# Patient Record
Sex: Male | Born: 1935 | Race: White | Hispanic: No | Marital: Married | Smoking: Never smoker
Health system: Southern US, Community
[De-identification: ages and names within clinical notes are randomized; demographics above are authoritative.]

## PROBLEM LIST (undated history)

## (undated) DIAGNOSIS — R059 Cough, unspecified: Secondary | ICD-10-CM

## (undated) DIAGNOSIS — R011 Cardiac murmur, unspecified: Secondary | ICD-10-CM

## (undated) DIAGNOSIS — I4891 Unspecified atrial fibrillation: Secondary | ICD-10-CM

## (undated) DIAGNOSIS — F0390 Unspecified dementia without behavioral disturbance: Secondary | ICD-10-CM

## (undated) DIAGNOSIS — Z8719 Personal history of other diseases of the digestive system: Secondary | ICD-10-CM

## (undated) DIAGNOSIS — E785 Hyperlipidemia, unspecified: Secondary | ICD-10-CM

## (undated) DIAGNOSIS — I10 Essential (primary) hypertension: Secondary | ICD-10-CM

## (undated) DIAGNOSIS — R05 Cough: Secondary | ICD-10-CM

## (undated) DIAGNOSIS — M109 Gout, unspecified: Secondary | ICD-10-CM

## (undated) DIAGNOSIS — G43909 Migraine, unspecified, not intractable, without status migrainosus: Secondary | ICD-10-CM

## (undated) DIAGNOSIS — K219 Gastro-esophageal reflux disease without esophagitis: Secondary | ICD-10-CM

## (undated) DIAGNOSIS — R413 Other amnesia: Secondary | ICD-10-CM

## (undated) DIAGNOSIS — B029 Zoster without complications: Secondary | ICD-10-CM

## (undated) DIAGNOSIS — M1990 Unspecified osteoarthritis, unspecified site: Secondary | ICD-10-CM

## (undated) DIAGNOSIS — I499 Cardiac arrhythmia, unspecified: Secondary | ICD-10-CM

## (undated) DIAGNOSIS — T4145XA Adverse effect of unspecified anesthetic, initial encounter: Secondary | ICD-10-CM

## (undated) DIAGNOSIS — T8859XA Other complications of anesthesia, initial encounter: Secondary | ICD-10-CM

## (undated) HISTORY — DX: Other amnesia: R41.3

## (undated) HISTORY — DX: Zoster without complications: B02.9

## (undated) HISTORY — DX: Migraine, unspecified, not intractable, without status migrainosus: G43.909

## (undated) HISTORY — DX: Hyperlipidemia, unspecified: E78.5

## (undated) HISTORY — PX: KNEE SURGERY: SHX244

## (undated) HISTORY — PX: COLONOSCOPY: SHX5424

## (undated) HISTORY — DX: Unspecified dementia, unspecified severity, without behavioral disturbance, psychotic disturbance, mood disturbance, and anxiety: F03.90

## (undated) HISTORY — PX: AXILLARY SURGERY: SHX892

## (undated) HISTORY — DX: Unspecified atrial fibrillation: I48.91

## (undated) HISTORY — PX: ANKLE SURGERY: SHX546

---

## 2003-08-20 ENCOUNTER — Other Ambulatory Visit: Payer: Self-pay

## 2005-01-08 ENCOUNTER — Ambulatory Visit: Payer: Self-pay | Admitting: Internal Medicine

## 2005-09-28 ENCOUNTER — Ambulatory Visit: Payer: Self-pay | Admitting: Unknown Physician Specialty

## 2006-03-17 ENCOUNTER — Ambulatory Visit: Payer: Self-pay | Admitting: Unknown Physician Specialty

## 2006-12-27 ENCOUNTER — Ambulatory Visit: Payer: Self-pay | Admitting: Internal Medicine

## 2007-11-05 ENCOUNTER — Emergency Department: Payer: Self-pay | Admitting: Emergency Medicine

## 2007-11-10 ENCOUNTER — Ambulatory Visit: Payer: Self-pay | Admitting: Physician Assistant

## 2007-12-13 ENCOUNTER — Ambulatory Visit: Payer: Self-pay | Admitting: Otolaryngology

## 2008-12-24 ENCOUNTER — Ambulatory Visit: Payer: Self-pay | Admitting: Urology

## 2009-01-01 ENCOUNTER — Ambulatory Visit: Payer: Self-pay | Admitting: Urology

## 2010-03-26 ENCOUNTER — Other Ambulatory Visit: Payer: Self-pay | Admitting: Rheumatology

## 2010-11-30 ENCOUNTER — Ambulatory Visit: Payer: Self-pay | Admitting: Unknown Physician Specialty

## 2011-04-26 ENCOUNTER — Ambulatory Visit: Payer: Self-pay | Admitting: Orthopedic Surgery

## 2011-05-09 ENCOUNTER — Observation Stay: Payer: Self-pay | Admitting: Internal Medicine

## 2011-05-09 LAB — CBC
HGB: 16.5 g/dL (ref 13.0–18.0)
MCH: 30.3 pg (ref 26.0–34.0)
MCHC: 33.7 g/dL (ref 32.0–36.0)
MCV: 90 fL (ref 80–100)
Platelet: 208 10*3/uL (ref 150–440)
RBC: 5.44 10*6/uL (ref 4.40–5.90)
RDW: 13.8 % (ref 11.5–14.5)

## 2011-05-09 LAB — TROPONIN I: Troponin-I: <0.02 ng/mL

## 2011-05-09 LAB — BASIC METABOLIC PANEL
Anion Gap: 10 (ref 7–16)
BUN: 10 mg/dL (ref 7–18)
Calcium, Total: 9 mg/dL (ref 8.5–10.1)
Chloride: 105 mmol/L (ref 98–107)
EGFR (African American): >60
Osmolality: 282 (ref 275–301)
Potassium: 3.8 mmol/L (ref 3.5–5.1)
Sodium: 142 mmol/L (ref 136–145)

## 2011-05-09 LAB — CK TOTAL AND CKMB (NOT AT ARMC)
CK, Total: 132 U/L (ref 35–232)
CK-MB: 2.2 ng/mL (ref 0.5–3.6)
CK-MB: 1.5 ng/mL (ref 0.5–3.6)

## 2011-05-10 LAB — CBC WITH DIFFERENTIAL/PLATELET
Basophil #: 0.1 10*3/uL (ref 0.0–0.1)
Basophil %: 0.6 %
Eosinophil #: 0 10*3/uL (ref 0.0–0.7)
Eosinophil %: 0.5 %
HCT: 45.9 % (ref 40.0–52.0)
HGB: 15.4 g/dL (ref 13.0–18.0)
Lymphocyte #: 2.9 10*3/uL (ref 1.0–3.6)
Lymphocyte %: 33.3 %
MCH: 30.3 pg (ref 26.0–34.0)
MCHC: 33.6 g/dL (ref 32.0–36.0)
MCV: 90 fL (ref 80–100)
Monocyte #: 1 x10 3/mm (ref 0.2–1.0)
Monocyte %: 10.9 %
Neutrophil #: 4.8 10*3/uL (ref 1.4–6.5)
Neutrophil %: 54.7 %
Platelet: 188 10*3/uL (ref 150–440)
RBC: 5.09 10*6/uL (ref 4.40–5.90)
RDW: 13.8 % (ref 11.5–14.5)
WBC: 8.7 10*3/uL (ref 3.8–10.6)

## 2011-05-10 LAB — LIPID PANEL
Cholesterol: 192 mg/dL (ref 0–200)
Triglycerides: 176 mg/dL (ref 0–200)

## 2011-05-10 LAB — TROPONIN I: Troponin-I: 0.03 ng/mL

## 2011-05-10 LAB — CK TOTAL AND CKMB (NOT AT ARMC): CK, Total: 75 U/L (ref 35–232)

## 2011-05-21 ENCOUNTER — Emergency Department: Payer: Self-pay | Admitting: Emergency Medicine

## 2011-12-27 DIAGNOSIS — N486 Induration penis plastica: Secondary | ICD-10-CM | POA: Insufficient documentation

## 2011-12-27 DIAGNOSIS — N401 Enlarged prostate with lower urinary tract symptoms: Secondary | ICD-10-CM | POA: Insufficient documentation

## 2011-12-27 DIAGNOSIS — M545 Low back pain, unspecified: Secondary | ICD-10-CM | POA: Insufficient documentation

## 2011-12-27 DIAGNOSIS — E291 Testicular hypofunction: Secondary | ICD-10-CM | POA: Insufficient documentation

## 2013-05-12 ENCOUNTER — Emergency Department: Payer: Self-pay | Admitting: Emergency Medicine

## 2013-10-04 DIAGNOSIS — C4490 Unspecified malignant neoplasm of skin, unspecified: Secondary | ICD-10-CM | POA: Insufficient documentation

## 2013-10-04 DIAGNOSIS — I340 Nonrheumatic mitral (valve) insufficiency: Secondary | ICD-10-CM | POA: Insufficient documentation

## 2013-10-04 DIAGNOSIS — I429 Cardiomyopathy, unspecified: Secondary | ICD-10-CM | POA: Insufficient documentation

## 2013-10-04 DIAGNOSIS — I482 Chronic atrial fibrillation, unspecified: Secondary | ICD-10-CM | POA: Insufficient documentation

## 2013-10-25 ENCOUNTER — Ambulatory Visit: Payer: Self-pay | Admitting: Unknown Physician Specialty

## 2013-11-01 DIAGNOSIS — I10 Essential (primary) hypertension: Secondary | ICD-10-CM | POA: Insufficient documentation

## 2013-11-01 DIAGNOSIS — E782 Mixed hyperlipidemia: Secondary | ICD-10-CM | POA: Insufficient documentation

## 2013-11-01 DIAGNOSIS — I5022 Chronic systolic (congestive) heart failure: Secondary | ICD-10-CM | POA: Insufficient documentation

## 2013-11-28 ENCOUNTER — Ambulatory Visit: Payer: Self-pay | Admitting: Unknown Physician Specialty

## 2014-04-08 DIAGNOSIS — H409 Unspecified glaucoma: Secondary | ICD-10-CM | POA: Insufficient documentation

## 2014-04-08 DIAGNOSIS — R110 Nausea: Secondary | ICD-10-CM | POA: Insufficient documentation

## 2014-04-08 DIAGNOSIS — K449 Diaphragmatic hernia without obstruction or gangrene: Secondary | ICD-10-CM | POA: Insufficient documentation

## 2014-04-08 DIAGNOSIS — M1990 Unspecified osteoarthritis, unspecified site: Secondary | ICD-10-CM | POA: Insufficient documentation

## 2014-04-08 DIAGNOSIS — H25019 Cortical age-related cataract, unspecified eye: Secondary | ICD-10-CM | POA: Insufficient documentation

## 2014-04-08 DIAGNOSIS — G629 Polyneuropathy, unspecified: Secondary | ICD-10-CM | POA: Insufficient documentation

## 2014-04-08 DIAGNOSIS — K219 Gastro-esophageal reflux disease without esophagitis: Secondary | ICD-10-CM | POA: Insufficient documentation

## 2014-04-08 DIAGNOSIS — N400 Enlarged prostate without lower urinary tract symptoms: Secondary | ICD-10-CM | POA: Insufficient documentation

## 2014-05-05 NOTE — H&P (Signed)
PATIENT NAME:  Glenn Garcia, BANAS MR#:  [id number] DATE OF BIRTH:  15-Nov-1935  DATE OF ADMISSION:  05/09/2011  PRIMARY CARE PHYSICIAN:  Dr. Doy Hutching.  ER PHYSICIAN: Dr. Belva Bertin.   CHIEF COMPLAINT: Chest pain.   HISTORY OF PRESENT ILLNESS: The patient is a 79 year old male with history of hypertension, gastroesophageal reflux disease, glaucoma came in because of chest pain when he was at church. The patient went to church this morning and then suddenly felt very weak and tired and associated with chest tightness, which lasted for 2-3 minutes and then went away. Again, he felt very dizzy and had nausea and trouble breathing and chest pressure. Again, it lasted for five minutes. No radiation. No aggravating or relieving factors. The patient received aspirin in the ER and now chest pain free.   PAST MEDICAL HISTORY:  1. Hypertension.  2. Chronic atrial fibrillation.  3. Glaucoma. 4. Gastroesophageal reflux disease.   ALLERGIES: No known allergies.   SOCIAL HISTORY: No smoking, no drinking, no drugs.   FAMILY HISTORY: Significant for diabetes.   MEDICATIONS:  1. Omeprazole 20 mg daily.  2. Amlodipine 10 mg daily.   PAST SURGICAL HISTORY:  1. History of hand surgery when he was young, around 79 years old. 2. History of leg surgery.   REVIEW OF SYSTEMS: CONSTITUTIONAL: He has fatigue and weakness. EYES: History of glaucoma present. ENT: Occasional hearing loss and denies any epistaxis. No difficulty swallowing. RESPIRATORY: Has cough because of allergies, but no trouble breathing. No asthma. CARDIOVASCULAR: Had chest pressure this morning. No orthopnea. No PND. No palpitations. GASTROINTESTINAL: Has some nausea this morning, but no abdominal pain. Has gastroesophageal reflux disease. Occasional constipation. GENITOURINARY: No dysuria. ENDOCRINE: No polyuria or nocturia. INTEGUMENTARY: No skin rash. MUSCULOSKELETAL: No joint pain. NEUROLOGIC: No numbness or weakness. No transient ischemic  attacks. Stable mood and affect. PSYCH: The patient has no depression.   PHYSICAL EXAMINATION:  VITAL SIGNS: Temperature 96, pulse 71, respirations 24, blood pressure 106/81, saturations 98% on room air.   GENERAL: Awake, oriented, answering questions appropriately.   HEENT: Pupils are equal, round and reactive to light and accommodation. Extraocular movements intact. No scleral icterus. No conjunctivitis. No pharyngeal erythema. The patient has normal mucosa. Dentition is good. Tympanic membranes are clear.   NECK: No thyroid enlargement. No masses. No lymphadenopathy. No JVD. No carotid bruit.   RESPIRATORY: Bilaterally clear to auscultation. No wheeze. No rales.   CARDIOVASCULAR: S1 and S2 regular. He is in atrial fib. PMI not displaced. Good pedal pulses. Good femoral and dorsalis pedis. The patient has no lower extremity edema.   ABDOMEN: Soft, nontender, nondistended. Bowel sounds present. No hernias.   MUSCULOSKELETAL: Power 5/5 in upper and lower extremities. Sensory intact. DP pulses 2+ bilaterally.   SKIN: No skin rashes.   LYMPH NODES: No lymphadenopathy.   NEUROLOGICAL: Cranial nerves II through XII are intact. Power 5/5 in upper and lower extremities. Sensation is intact, 2+ bilaterally.   PSYCH: Oriented to time, place, and person.   LABORATORY, DIAGNOSTIC AND RADIOLOGICAL DATA: Chest x-ray showed no acute disease of the chest. WBC 6.6, hemoglobin 16.5, hematocrit 49, platelets 208. Electrolytes: Sodium 142, potassium 3.8, chloride 105, bicarbonate 27, BUN 10, creatinine 0.92, glucose 101. Troponin less than 0.02. CK total 132, CPK-MB 2.2. EKG showed atrial fibrillation with ventricular response 80 beats per minute.   ASSESSMENT AND PLAN:  54. 79 year old male with chest pain associated with dizziness and weakness. Rule out myocardial infarction. The patient will have two more  sets of troponin and will continue aspirin, beta blockers, nitrates. He will also have fasting  lipids checked. Get a stress test in the morning.  2. Chronic atrial fib. The patient has seen Dr. Nehemiah Massed before and was prescribed full dose aspirin, but he said he took two months and then developed a lot of bruising and then stopped by himself. We are going to continue aspirin 325 daily and also get an echo and continue Lovenox. Further anticoagulation depending on echo results.  3. Gastroesophageal reflux disease. Continue PPIs.  4. History of glaucoma. Continue eyedrops.   TOTAL TIME SPENT ON HISTORY AND PHYSICAL: About 55 minutes.  The patient will be seen by Dr. Doy Hutching tomorrow.    ____________________________ Epifanio Lesches, MD sk:ap D: 05/09/2011 13:15:04 ET T: 05/09/2011 13:53:30 ET JOB#: [id number]  cc: Epifanio Lesches, MD, <Dictator> Leonie Douglas. Doy Hutching, MD Epifanio Lesches MD ELECTRONICALLY SIGNED 05/10/2011 12:54

## 2014-05-05 NOTE — Consult Note (Signed)
General Aspect This is a 79 year old male that developed chest pain while in church yesterday, he presented to the ER.  He described this as a sharp pain that ran through the center of his chest. He became dizzy, nauseated and presented to the emergency room. His EKG was negative for acute changes.  He does have chronic afebrile.  His cardiac enzymes have been negative. after he was admitted he developed further chest pain again with dizziness, nausea.  He was moved to the unit for further monitoring.  He is in chronic atrial fibrillation, rate controlled.  He has a chads2 score of 1 indisposed be on 325 mg of aspirin.  However, he developed bruising and has been taking a baby aspirin a day.  Since his episode yesterday.  He has been feeling back to his usual baseline.  He has no complaints today.  He is pending a stress test and an echo.   Physical Exam:   GEN well developed    HEENT pink conjunctivae, hearing intact to voice    NECK supple    RESP normal resp effort  clear BS    CARD Irregular rate and rhythm  Afib controlled    ABD denies tenderness  normal BS    EXTR negative edema    SKIN normal to palpation    NEURO cranial nerves intact, motor/sensory function intact    PSYCH alert, A+O to time, place, person, good insight   Review of Systems:   Subjective/Chief Complaint Sharp chest pain associated with dizziness, nausea.    General: No Complaints    Skin: No Complaints    ENT: No Complaints    Eyes: No Complaints    Neck: No Complaints    Respiratory: No Complaints    Cardiovascular: Chest pain or discomfort    Gastrointestinal: No Complaints    Genitourinary: No Complaints    Musculoskeletal: No Complaints    Neurologic: No Complaints    Hematologic: No Complaints    Endocrine: No Complaints    Psychiatric: No Complaints    Medications/Allergies Reviewed Medications/Allergies reviewed   Routine Hem:  28-Apr-13 10:14    WBC (CBC) 6.6   RBC  (CBC) 5.44   Hemoglobin (CBC) 16.5   Hematocrit (CBC) 49.0   Platelet Count (CBC) 208   MCV 90   MCH 30.3   MCHC 33.7   RDW 13.8  Routine Chem:  28-Apr-13 10:14    Glucose, Serum 101   BUN 10   Creatinine (comp) 0.92   Sodium, Serum 142   Potassium, Serum 3.8   Chloride, Serum 105   CO2, Serum 27   Calcium (Total), Serum 9.0   Anion Gap 10   Osmolality (calc) 282   eGFR (African American) >60   eGFR (Non-African American) >60  Cardiac:  28-Apr-13 10:14    Troponin I < 0.02   CK, Total 132   CPK-MB, Serum 2.2  Blood Glucose:  28-Apr-13 16:37    POCT Blood Glucose 135  Cardiology:  28-Apr-13 16:43    Ventricular Rate 69   Atrial Rate 68   QRS Duration 78   QT 406   QTc 435   R Axis -29   T Axis 28  Cardiac:  28-Apr-13 17:32    Troponin I < 0.02   CK, Total 94   CPK-MB, Serum 1.5  Routine Hem:  29-Apr-13 02:09    WBC (CBC) 8.7   RBC (CBC) 5.09   Hemoglobin (CBC) 15.4   Hematocrit (CBC)  45.9   Platelet Count (CBC) 188   MCV 90   MCH 30.3   MCHC 33.6   RDW 13.8  Cardiac:  29-Apr-13 02:09    Troponin I 0.03   CK, Total 75   CPK-MB, Serum 1.3  Routine Hem:  29-Apr-13 02:09    Neutrophil % 54.7   Lymphocyte % 33.3   Monocyte % 10.9   Eosinophil % 0.5   Basophil % 0.6   Neutrophil # 4.8   Lymphocyte # 2.9   Monocyte # 1.0   Eosinophil # 0.0   Basophil # 0.1  Routine Chem:  29-Apr-13 02:09    Cholesterol, Serum 192   Triglycerides, Serum 176   HDL (INHOUSE) 44   VLDL Cholesterol Calculated 35   LDL Cholesterol Calculated 113  Blood Glucose:  29-Apr-13 07:49    POCT Blood Glucose 92   Radiology Results: XRay:    28-Apr-13 12:06, Chest Portable Single View   Chest Portable Single View    REASON FOR EXAM:    chest pain  COMMENTS:       PROCEDURE: DXR - DXR PORTABLE CHEST SINGLE VIEW  - May 09 2011 12:06PM     RESULT: Comparison: 11/05/2007    Findings:     Single portable AP chest radiograph is provided.  There is no focal    parenchymal opacity, pleural effusion, or pneumothorax. Normal   cardiomediastinal silhouette. The osseous structures are unremarkable.    IMPRESSION:     No acute disease of the chest.    Dictation Site: 3          Verified By: Jennette Banker, M.D., MD  Cardiology:    28-Apr-13 16:43, ECG   QRS Duration 78    No Known Allergies:   Vital Signs/Nurse's Notes: **Vital Signs.:   29-Apr-13 14:00   Vital Signs Type Routine   Pulse Pulse 74   Respirations Respirations 18   Systolic BP Systolic BP 459   Diastolic BP (mmHg) Diastolic BP (mmHg) 78   Mean BP 90   Pulse Ox % Pulse Ox % 96   Oxygen Delivery Room Air/ 21 %     Impression 79 year old male with chronic atrial fibrillation, presenting with sharp chest pain, dizziness, nausea, with negative EKG and cardiac enzymes feeling back to his usual self today.    Plan 1.  Continue current medications without change. 2.  Stress Myoview pending. 3.  Surface echocardiogram pending. 4..  Further recommendations per Dr. Nehemiah Massed after the above test results are known.  patient was sitting collaboration with Serafina Royals, M.D.   Electronic Signatures: Roderic Palau (NP)  (Signed 29-Apr-13 15:21)  Authored: General Aspect/Present Illness, History and Physical Exam, Review of System, Labs, Radiology, Allergies, Vital Signs/Nurse's Notes, Impression/Plan   Last Updated: 29-Apr-13 15:21 by Roderic Palau (NP)

## 2014-05-06 LAB — SURGICAL PATHOLOGY

## 2014-06-13 DIAGNOSIS — I10 Essential (primary) hypertension: Secondary | ICD-10-CM | POA: Insufficient documentation

## 2014-07-01 DIAGNOSIS — I071 Rheumatic tricuspid insufficiency: Secondary | ICD-10-CM | POA: Insufficient documentation

## 2015-01-01 DIAGNOSIS — I340 Nonrheumatic mitral (valve) insufficiency: Secondary | ICD-10-CM | POA: Insufficient documentation

## 2015-01-08 ENCOUNTER — Encounter: Payer: Self-pay | Admitting: *Deleted

## 2015-01-16 ENCOUNTER — Ambulatory Visit
Admission: RE | Admit: 2015-01-16 | Discharge: 2015-01-16 | Disposition: A | Discharge status: Home / Self Care | Payer: Medicare HMO | Source: Ambulatory Visit | Attending: Ophthalmology | Admitting: Ophthalmology

## 2015-01-16 ENCOUNTER — Encounter: Payer: Self-pay | Admitting: *Deleted

## 2015-01-16 ENCOUNTER — Ambulatory Visit: Payer: Medicare HMO | Admitting: Certified Registered"

## 2015-01-16 ENCOUNTER — Encounter
Admission: RE | Disposition: A | Discharge status: Home / Self Care | Payer: Self-pay | Source: Ambulatory Visit | Attending: Ophthalmology

## 2015-01-16 DIAGNOSIS — Z7901 Long term (current) use of anticoagulants: Secondary | ICD-10-CM | POA: Insufficient documentation

## 2015-01-16 DIAGNOSIS — M109 Gout, unspecified: Secondary | ICD-10-CM | POA: Diagnosis not present

## 2015-01-16 DIAGNOSIS — Z9889 Other specified postprocedural states: Secondary | ICD-10-CM | POA: Diagnosis not present

## 2015-01-16 DIAGNOSIS — Z888 Allergy status to other drugs, medicaments and biological substances status: Secondary | ICD-10-CM | POA: Insufficient documentation

## 2015-01-16 DIAGNOSIS — Z85828 Personal history of other malignant neoplasm of skin: Secondary | ICD-10-CM | POA: Diagnosis not present

## 2015-01-16 DIAGNOSIS — Z85118 Personal history of other malignant neoplasm of bronchus and lung: Secondary | ICD-10-CM | POA: Insufficient documentation

## 2015-01-16 DIAGNOSIS — K219 Gastro-esophageal reflux disease without esophagitis: Secondary | ICD-10-CM | POA: Insufficient documentation

## 2015-01-16 DIAGNOSIS — H2511 Age-related nuclear cataract, right eye: Secondary | ICD-10-CM | POA: Diagnosis not present

## 2015-01-16 DIAGNOSIS — H2513 Age-related nuclear cataract, bilateral: Secondary | ICD-10-CM | POA: Diagnosis not present

## 2015-01-16 DIAGNOSIS — Z79899 Other long term (current) drug therapy: Secondary | ICD-10-CM | POA: Insufficient documentation

## 2015-01-16 DIAGNOSIS — M1991 Primary osteoarthritis, unspecified site: Secondary | ICD-10-CM | POA: Insufficient documentation

## 2015-01-16 DIAGNOSIS — I4891 Unspecified atrial fibrillation: Secondary | ICD-10-CM | POA: Insufficient documentation

## 2015-01-16 DIAGNOSIS — E7800 Pure hypercholesterolemia, unspecified: Secondary | ICD-10-CM | POA: Insufficient documentation

## 2015-01-16 DIAGNOSIS — I10 Essential (primary) hypertension: Secondary | ICD-10-CM | POA: Diagnosis not present

## 2015-01-16 DIAGNOSIS — T7840XA Allergy, unspecified, initial encounter: Secondary | ICD-10-CM | POA: Diagnosis not present

## 2015-01-16 DIAGNOSIS — H269 Unspecified cataract: Secondary | ICD-10-CM | POA: Diagnosis present

## 2015-01-16 HISTORY — DX: Cough, unspecified: R05.9

## 2015-01-16 HISTORY — DX: Other complications of anesthesia, initial encounter: T88.59XA

## 2015-01-16 HISTORY — DX: Gout, unspecified: M10.9

## 2015-01-16 HISTORY — DX: Unspecified osteoarthritis, unspecified site: M19.90

## 2015-01-16 HISTORY — DX: Cardiac murmur, unspecified: R01.1

## 2015-01-16 HISTORY — DX: Personal history of other diseases of the digestive system: Z87.19

## 2015-01-16 HISTORY — DX: Gastro-esophageal reflux disease without esophagitis: K21.9

## 2015-01-16 HISTORY — PX: CATARACT EXTRACTION W/PHACO: SHX586

## 2015-01-16 HISTORY — DX: Essential (primary) hypertension: I10

## 2015-01-16 HISTORY — DX: Cardiac arrhythmia, unspecified: I49.9

## 2015-01-16 HISTORY — DX: Adverse effect of unspecified anesthetic, initial encounter: T41.45XA

## 2015-01-16 HISTORY — DX: Cough: R05

## 2015-01-16 SURGERY — PHACOEMULSIFICATION, CATARACT, WITH IOL INSERTION
Anesthesia: Monitor Anesthesia Care | Laterality: Right | Wound class: Clean

## 2015-01-16 MED ORDER — NA HYALUR & NA CHOND-NA HYALUR 0.55-0.5 ML IO KIT
PACK | INTRAOCULAR | Status: AC
  Filled 2015-01-16: qty 1.05

## 2015-01-16 MED ORDER — POVIDONE-IODINE 5 % OP SOLN
1.0000 "application " | OPHTHALMIC | Status: AC | PRN
  Administered 2015-01-16: 1 via OPHTHALMIC

## 2015-01-16 MED ORDER — ARMC OPHTHALMIC DILATING GEL
1.0000 "application " | OPHTHALMIC | Status: AC | PRN
  Administered 2015-01-16 (×2): 1 via OPHTHALMIC

## 2015-01-16 MED ORDER — EPINEPHRINE HCL 1 MG/ML IJ SOLN
INTRAMUSCULAR | Status: AC
  Filled 2015-01-16: qty 1

## 2015-01-16 MED ORDER — ARMC OPHTHALMIC DILATING GEL
OPHTHALMIC | Status: AC
  Administered 2015-01-16: 1 via OPHTHALMIC
  Filled 2015-01-16: qty 0.25

## 2015-01-16 MED ORDER — MOXIFLOXACIN HCL 0.5 % OP SOLN: 1.0000 [drp] | OPHTHALMIC | Status: DC | PRN

## 2015-01-16 MED ORDER — NA HYALUR & NA CHOND-NA HYALUR 0.4-0.35 ML IO KIT
PACK | INTRAOCULAR | Status: DC | PRN
  Administered 2015-01-16: .75 mL via INTRAOCULAR

## 2015-01-16 MED ORDER — MOXIFLOXACIN HCL 0.5 % OP SOLN
OPHTHALMIC | Status: AC
  Filled 2015-01-16: qty 3

## 2015-01-16 MED ORDER — TETRACAINE HCL 0.5 % OP SOLN
1.0000 [drp] | OPHTHALMIC | Status: AC | PRN
  Administered 2015-01-16: 1 [drp] via OPHTHALMIC

## 2015-01-16 MED ORDER — NEOMYCIN-POLYMYXIN-DEXAMETH 3.5-10000-0.1 OP OINT
TOPICAL_OINTMENT | OPHTHALMIC | Status: AC
  Filled 2015-01-16: qty 3.5

## 2015-01-16 MED ORDER — CEFUROXIME OPHTHALMIC INJECTION 1 MG/0.1 ML
INJECTION | OPHTHALMIC | Status: DC | PRN
  Administered 2015-01-16: .1 mL via INTRACAMERAL

## 2015-01-16 MED ORDER — POVIDONE-IODINE 5 % OP SOLN
OPHTHALMIC | Status: AC
  Administered 2015-01-16: 1 via OPHTHALMIC
  Filled 2015-01-16: qty 30

## 2015-01-16 MED ORDER — TETRACAINE HCL 0.5 % OP SOLN
OPHTHALMIC | Status: AC
  Administered 2015-01-16: 1 [drp] via OPHTHALMIC
  Filled 2015-01-16: qty 2

## 2015-01-16 MED ORDER — SODIUM CHLORIDE 0.9 % IV SOLN
INTRAVENOUS | Status: DC
  Administered 2015-01-16: 07:00:00 via INTRAVENOUS

## 2015-01-16 MED ORDER — NEOMYCIN-POLYMYXIN-DEXAMETH 0.1 % OP OINT
TOPICAL_OINTMENT | OPHTHALMIC | Status: DC | PRN
  Administered 2015-01-16: 1 via OPHTHALMIC

## 2015-01-16 MED ORDER — LIDOCAINE HCL (PF) 1 % IJ SOLN
INTRAMUSCULAR | Status: AC
  Filled 2015-01-16: qty 2

## 2015-01-16 MED ORDER — LIDOCAINE HCL (PF) 4 % IJ SOLN
INTRAMUSCULAR | Status: AC
  Filled 2015-01-16: qty 5

## 2015-01-16 MED ORDER — EPINEPHRINE HCL 1 MG/ML IJ SOLN
INTRAOCULAR | Status: DC | PRN
  Administered 2015-01-16: 250 mL via OPHTHALMIC

## 2015-01-16 MED ORDER — CARBACHOL 0.01 % IO SOLN
INTRAOCULAR | Status: DC | PRN
  Administered 2015-01-16: .5 mL via INTRAOCULAR

## 2015-01-16 MED ORDER — CEFUROXIME OPHTHALMIC INJECTION 1 MG/0.1 ML
INJECTION | OPHTHALMIC | Status: AC
  Filled 2015-01-16: qty 0.1

## 2015-01-16 SURGICAL SUPPLY — 22 items
CANNULA ANT/CHMB 27GA (MISCELLANEOUS) ×2 IMPLANT
CUP MEDICINE 2OZ PLAST GRAD ST (MISCELLANEOUS) ×2 IMPLANT
GLOVE BIO SURGEON STRL SZ8 (GLOVE) ×2 IMPLANT
GLOVE BIOGEL M 6.5 STRL (GLOVE) ×2 IMPLANT
GLOVE SURG LX 7.5 STRW (GLOVE) ×1
GLOVE SURG LX STRL 7.5 STRW (GLOVE) ×1 IMPLANT
GOWN STRL REUS W/ TWL LRG LVL3 (GOWN DISPOSABLE) ×2 IMPLANT
GOWN STRL REUS W/TWL LRG LVL3 (GOWN DISPOSABLE) ×2
LENS IOL TECNIS 20.0 (Intraocular Lens) ×2 IMPLANT
LENS IOL TECNIS MONO 1P 20.0 (Intraocular Lens) ×1 IMPLANT
PACK CATARACT (MISCELLANEOUS) ×2 IMPLANT
PACK CATARACT BRASINGTON LX (MISCELLANEOUS) ×2 IMPLANT
PACK EYE AFTER SURG (MISCELLANEOUS) ×2 IMPLANT
SOL BSS BAG (MISCELLANEOUS) ×2
SOL PREP PVP 2OZ (MISCELLANEOUS) ×2
SOLUTION BSS BAG (MISCELLANEOUS) ×1 IMPLANT
SOLUTION PREP PVP 2OZ (MISCELLANEOUS) ×1 IMPLANT
SYR 3ML LL SCALE MARK (SYRINGE) ×2 IMPLANT
SYR 5ML LL (SYRINGE) ×2 IMPLANT
SYR TB 1ML 27GX1/2 LL (SYRINGE) ×2 IMPLANT
WATER STERILE IRR 1000ML POUR (IV SOLUTION) ×2 IMPLANT
WIPE NON LINTING 3.25X3.25 (MISCELLANEOUS) ×2 IMPLANT

## 2015-01-16 NOTE — Anesthesia Postprocedure Evaluation (Signed)
Anesthesia Post Note  Patient: Glenn Garcia  Procedure(s) Performed: Procedure(s) (LRB): CATARACT EXTRACTION PHACO AND INTRAOCULAR LENS PLACEMENT (IOC) (Right)  Patient location during evaluation: Short Stay Anesthesia Type: MAC Level of consciousness: awake and alert, oriented and patient cooperative Pain management: satisfactory to patient Vital Signs Assessment: post-procedure vital signs reviewed and stable Respiratory status: respiratory function stable Cardiovascular status: stable Postop Assessment: no headache and no backache Anesthetic complications: no    Last Vitals:  Filed Vitals:   01/16/15 0619  BP: 129/79  Pulse: 84  Temp: 35.8 C  Resp: 16    Last Pain: There were no vitals filed for this visit.               Silvana Newness A

## 2015-01-16 NOTE — Op Note (Signed)
OPERATIVE NOTE  Jaesean Olstad Uhls [id number] 01/16/2015   PREOPERATIVE DIAGNOSIS:  Nuclear Sclerotic Cataract Right Eye H25.11   POSTOPERATIVE DIAGNOSIS: Nuclear Sclerotic Cataract Right Eye H25.11          PROCEDURE:  Phacoemusification with posterior chamber intraocular lens placement of the right eye   LENS:   Implant Name Type Inv. Item Serial No. Manufacturer Lot No. LRB No. Used  LENS IMPL INTRAOC [id number] 20.0 - [id number] Intraocular Lens LENS IMPL INTRAOC ZCB00 20.0 [id number] AMO   Right 1       ULTRASOUND TIME: 14 %  of 1 minutes 36 seconds, CDE 13.6  SURGEON:  Wyonia Hough, MD   ANESTHESIA:  Topical with tetracaine drops and 2% Xylocaine jelly.   COMPLICATIONS:  None.   DESCRIPTION OF PROCEDURE:  The patient was identified in the holding room and transported to the operating room and placed in the supine position under the operating microscope. Theright eye was identified as the operative eye and it was prepped and draped in the usual sterile ophthalmic fashion.   A 1 millimeter clear-corneal paracentesis was made at the 12:00 position.  The anterior chamber was filled with Viscoat viscoelastic.  A 2.4 millimeter keratome was used to make a near-clear corneal incision at the 9:00 position. A curvilinear capsulorrhexis was made with a cystotome and capsulorrhexis forceps.  Balanced salt solution was used to hydrodissect and hydrodelineate the nucleus.   Phacoemulsification was then used in stop and chop fashion to remove the lens nucleus and epinucleus.  The remaining cortex was then removed using the irrigation and aspiration handpiece. Provisc was then placed into the capsular bag to distend it for lens placement.  A lens was then injected into the capsular bag.  The remaining viscoelastic was aspirated.  Wounds were hydrated with balanced salt solution.  The anterior chamber was inflated to a physiologic pressure with balanced salt solution. Cefuroxime 0.1 ml of  a 10mg /ml solution was injected into the anterior chamber for a dose of 1 mg of intracameral antibiotic at the completion of the case. Miostat was placed into the anterior chamber to constrict the pupil.  No wound leaks were noted.  Topical Vigamox drops and Maxitrol ointment were applied to the eye.  The patient was taken to the recovery room in stable condition without complications of anesthesia or surgery.  Aki Abalos 01/16/2015, 7:58 AM

## 2015-01-16 NOTE — Anesthesia Preprocedure Evaluation (Signed)
Anesthesia Evaluation  Patient identified by MRN, date of birth, ID band Patient awake    Reviewed: Allergy & Precautions, H&P , NPO status , Patient's Chart, lab work & pertinent test results, reviewed documented beta blocker date and time   History of Anesthesia Complications (+) PROLONGED EMERGENCE and history of anesthetic complications  Airway Mallampati: II  TM Distance: >3 FB Neck ROM: full    Dental no notable dental hx. (+) Upper Dentures, Lower Dentures   Pulmonary neg pulmonary ROS,    Pulmonary exam normal breath sounds clear to auscultation       Cardiovascular Exercise Tolerance: Good hypertension, On Medications (-) angina(-) CAD, (-) Past MI, (-) Cardiac Stents and (-) CABG Normal cardiovascular exam+ dysrhythmias Atrial Fibrillation + Valvular Problems/Murmurs  Rhythm:regular Rate:Normal     Neuro/Psych negative neurological ROS  negative psych ROS   GI/Hepatic Neg liver ROS, hiatal hernia, GERD  Medicated,  Endo/Other  negative endocrine ROS  Renal/GU negative Renal ROS  negative genitourinary   Musculoskeletal   Abdominal   Peds  Hematology negative hematology ROS (+)   Anesthesia Other Findings Past Medical History:   Dysrhythmia                                                    Comment:a fib   Hypertension                                                 Heart murmur                                                 GERD (gastroesophageal reflux disease)                       History of hiatal hernia                                     Arthritis                                                    Gout                                                         Cancer (HCC)                                                   Comment:lung   Complication of anesthesia  Comment:slow waking after colonscopy   Cough                                                          Comment:chronic   Reproductive/Obstetrics negative OB ROS                             Anesthesia Physical Anesthesia Plan  ASA: III  Anesthesia Plan: MAC   Post-op Pain Management:    Induction:   Airway Management Planned:   Additional Equipment:   Intra-op Plan:   Post-operative Plan:   Informed Consent: I have reviewed the patients History and Physical, chart, labs and discussed the procedure including the risks, benefits and alternatives for the proposed anesthesia with the patient or authorized representative who has indicated his/her understanding and acceptance.   Dental Advisory Given  Plan Discussed with: Anesthesiologist, CRNA and Surgeon  Anesthesia Plan Comments:         Anesthesia Quick Evaluation

## 2015-01-16 NOTE — Transfer of Care (Signed)
Immediate Anesthesia Transfer of Care Note  Patient: Glenn Garcia  Procedure(s) Performed: Procedure(s) with comments: CATARACT EXTRACTION PHACO AND INTRAOCULAR LENS PLACEMENT (IOC) (Right) - [place]   1:36  AP     14.2 CDE  13.55 casette lot    [id number]  Patient Location: Short Stay  Anesthesia Type:MAC  Level of Consciousness: awake, alert , oriented and patient cooperative  Airway & Oxygen Therapy: Patient Spontanous Breathing  Post-op Assessment: Report given to RN, Post -op Vital signs reviewed and stable and Patient moving all extremities X 4  Post vital signs: Reviewed and stable  Last Vitals:  Filed Vitals:   01/16/15 0619  BP: 129/79  Pulse: 84  Temp: 35.8 C  Resp: 16    Complications: No apparent anesthesia complications

## 2015-01-16 NOTE — Discharge Instructions (Signed)
Eye Surgery Discharge Instructions  Expect mild scratchy sensation or mild soreness. DO NOT RUB YOUR EYE!  The day of surgery:  Minimal physical activity, but bed rest is not required  No reading, computer work, or close hand work  No bending, lifting, or straining.  May watch TV  For 24 hours:  No driving, legal decisions, or alcoholic beverages  Safety precautions  Eat anything you prefer: It is better to start with liquids, then soup then solid foods.  _____ Eye patch should be worn until postoperative exam tomorrow.  ____ Solar shield eyeglasses should be worn for comfort in the sunlight/patch while sleeping  Resume all regular medications including aspirin or Coumadin if these were discontinued prior to surgery. You may shower, bathe, shave, or wash your hair. Tylenol may be taken for mild discomfort.  Call your doctor if you experience significant pain, nausea, or vomiting, fever > 101 or other signs of infection. [phone] or [phone] Specific instructions:  Follow-up Information    Follow up with Leandrew Koyanagi, MD On 01/17/2015.   Specialty:  Ophthalmology   Why:  9:00   Contact information:   [id number]   [place] [address] [phone]

## 2015-01-16 NOTE — H&P (Signed)
  The History and Physical notes are on paper, have been signed, and are to be scanned. The patient remains stable and unchanged from the H&P.   Previous H&P reviewed, patient examined, and there are no changes.  Erie Sica 01/16/2015 7:28 AM

## 2015-01-20 ENCOUNTER — Encounter: Payer: Self-pay | Admitting: Ophthalmology

## 2015-01-30 DIAGNOSIS — J040 Acute laryngitis: Secondary | ICD-10-CM | POA: Diagnosis not present

## 2015-01-30 DIAGNOSIS — R05 Cough: Secondary | ICD-10-CM | POA: Diagnosis not present

## 2015-01-30 DIAGNOSIS — K219 Gastro-esophageal reflux disease without esophagitis: Secondary | ICD-10-CM | POA: Diagnosis not present

## 2015-01-30 DIAGNOSIS — J069 Acute upper respiratory infection, unspecified: Secondary | ICD-10-CM | POA: Diagnosis not present

## 2015-02-04 DIAGNOSIS — M150 Primary generalized (osteo)arthritis: Secondary | ICD-10-CM | POA: Diagnosis not present

## 2015-02-04 DIAGNOSIS — M47812 Spondylosis without myelopathy or radiculopathy, cervical region: Secondary | ICD-10-CM | POA: Diagnosis not present

## 2015-02-04 DIAGNOSIS — M1990 Unspecified osteoarthritis, unspecified site: Secondary | ICD-10-CM | POA: Diagnosis not present

## 2015-02-04 DIAGNOSIS — M79671 Pain in right foot: Secondary | ICD-10-CM | POA: Diagnosis not present

## 2015-02-04 DIAGNOSIS — M542 Cervicalgia: Secondary | ICD-10-CM | POA: Diagnosis not present

## 2015-02-12 ENCOUNTER — Other Ambulatory Visit: Payer: Self-pay | Admitting: Internal Medicine

## 2015-02-12 DIAGNOSIS — R51 Headache: Secondary | ICD-10-CM

## 2015-02-12 DIAGNOSIS — M5030 Other cervical disc degeneration, unspecified cervical region: Secondary | ICD-10-CM | POA: Diagnosis not present

## 2015-02-12 DIAGNOSIS — R519 Headache, unspecified: Secondary | ICD-10-CM

## 2015-02-12 DIAGNOSIS — I482 Chronic atrial fibrillation: Secondary | ICD-10-CM | POA: Diagnosis not present

## 2015-02-12 DIAGNOSIS — G609 Hereditary and idiopathic neuropathy, unspecified: Secondary | ICD-10-CM | POA: Diagnosis not present

## 2015-02-12 DIAGNOSIS — I428 Other cardiomyopathies: Secondary | ICD-10-CM | POA: Diagnosis not present

## 2015-02-12 DIAGNOSIS — I5022 Chronic systolic (congestive) heart failure: Secondary | ICD-10-CM | POA: Diagnosis not present

## 2015-02-12 DIAGNOSIS — M1990 Unspecified osteoarthritis, unspecified site: Secondary | ICD-10-CM | POA: Diagnosis not present

## 2015-02-28 ENCOUNTER — Ambulatory Visit: Payer: Medicare HMO

## 2015-02-28 ENCOUNTER — Other Ambulatory Visit: Payer: Medicare HMO

## 2015-04-30 ENCOUNTER — Emergency Department: Payer: Medicare HMO

## 2015-04-30 ENCOUNTER — Encounter: Payer: Self-pay | Admitting: Emergency Medicine

## 2015-04-30 ENCOUNTER — Emergency Department
Admission: EM | Admit: 2015-04-30 | Discharge: 2015-04-30 | Disposition: A | Discharge status: Home / Self Care | Payer: Medicare HMO | Attending: Emergency Medicine | Admitting: Emergency Medicine

## 2015-04-30 DIAGNOSIS — R51 Headache: Secondary | ICD-10-CM | POA: Diagnosis not present

## 2015-04-30 DIAGNOSIS — R519 Headache, unspecified: Secondary | ICD-10-CM

## 2015-04-30 DIAGNOSIS — M1990 Unspecified osteoarthritis, unspecified site: Secondary | ICD-10-CM | POA: Insufficient documentation

## 2015-04-30 DIAGNOSIS — K219 Gastro-esophageal reflux disease without esophagitis: Secondary | ICD-10-CM | POA: Insufficient documentation

## 2015-04-30 DIAGNOSIS — Z79899 Other long term (current) drug therapy: Secondary | ICD-10-CM | POA: Insufficient documentation

## 2015-04-30 DIAGNOSIS — R079 Chest pain, unspecified: Secondary | ICD-10-CM | POA: Diagnosis not present

## 2015-04-30 DIAGNOSIS — I4891 Unspecified atrial fibrillation: Secondary | ICD-10-CM | POA: Insufficient documentation

## 2015-04-30 DIAGNOSIS — I10 Essential (primary) hypertension: Secondary | ICD-10-CM | POA: Diagnosis not present

## 2015-04-30 DIAGNOSIS — Z7901 Long term (current) use of anticoagulants: Secondary | ICD-10-CM | POA: Diagnosis not present

## 2015-04-30 LAB — BASIC METABOLIC PANEL
ANION GAP: 9 (ref 5–15)
BUN: 14 mg/dL (ref 6–20)
CALCIUM: 9.6 mg/dL (ref 8.9–10.3)
CO2: 28 mmol/L (ref 22–32)
Chloride: 97 mmol/L — ABNORMAL LOW (ref 101–111)
Creatinine, Ser: 1.09 mg/dL (ref 0.61–1.24)
GLUCOSE: 176 mg/dL — AB (ref 65–99)
POTASSIUM: 3.9 mmol/L (ref 3.5–5.1)
SODIUM: 134 mmol/L — AB (ref 135–145)

## 2015-04-30 LAB — URINALYSIS COMPLETE WITH MICROSCOPIC (ARMC ONLY)
Bacteria, UA: NONE SEEN
Bilirubin Urine: NEGATIVE
Glucose, UA: >500 mg/dL — AB
HGB URINE DIPSTICK: NEGATIVE
KETONES UR: NEGATIVE mg/dL
LEUKOCYTES UA: NEGATIVE
NITRITE: NEGATIVE
PROTEIN: NEGATIVE mg/dL
SPECIFIC GRAVITY, URINE: 1 — AB (ref 1.005–1.030)
Squamous Epithelial / LPF: NONE SEEN
pH: 6 (ref 5.0–8.0)

## 2015-04-30 LAB — CBC
HEMATOCRIT: 48.7 % (ref 40.0–52.0)
HEMOGLOBIN: 16.3 g/dL (ref 13.0–18.0)
MCH: 30.2 pg (ref 26.0–34.0)
MCHC: 33.6 g/dL (ref 32.0–36.0)
MCV: 90.1 fL (ref 80.0–100.0)
Platelets: 216 10*3/uL (ref 150–440)
RBC: 5.41 MIL/uL (ref 4.40–5.90)
RDW: 15.7 % — ABNORMAL HIGH (ref 11.5–14.5)
WBC: 10.5 10*3/uL (ref 3.8–10.6)

## 2015-04-30 LAB — TROPONIN I

## 2015-04-30 MED ORDER — OXYCODONE-ACETAMINOPHEN 5-325 MG PO TABS
1.0000 | ORAL_TABLET | Freq: Once | ORAL | Status: DC
  Filled 2015-04-30: qty 1

## 2015-04-30 MED ORDER — OXYCODONE-ACETAMINOPHEN 5-325 MG PO TABS: 1.0000 | ORAL_TABLET | Freq: Three times a day (TID) | ORAL | Status: DC | PRN

## 2015-04-30 NOTE — ED Notes (Signed)
Patient presents to the ED with chest pain and weakness.  Patient states he had some chest pain this morning in the center of his chest and he took a nap and after his nap his chest pain was on the right side.  Patient also has marked generalized weakness.  Patient takes Xarelto for Afib.  Patient reports a severe headache that lasted for several minutes.  Patient denies headache now.  Patient reports shortness of breath this morning but denies at this time.

## 2015-04-30 NOTE — ED Provider Notes (Signed)
[facility] Emergency Department Provider Note     Time seen: ----------------------------------------- 4:47 PM on 04/30/2015 -----------------------------------------    I have reviewed the triage vital signs and the nursing notes.   HISTORY  Chief Complaint Chest Pain    HPI Glenn Garcia is a 80 y.o. male who presents ER for chest pain and weakness. Patient states he has some chest pain this morning the center of his chest and he took a nap and after his nap chest Rales in the right side. He's had some generalized weakness, as well as on Xarelto for A. fib. He also reports severe headache and states she still has a posterior headache now. He stated shortness of breath earlier but denies currently.   Past Medical History  Diagnosis Date  . Dysrhythmia     a fib  . Hypertension   . Heart murmur   . GERD (gastroesophageal reflux disease)   . History of hiatal hernia   . Arthritis   . Gout   . Complication of anesthesia     slow waking after colonscopy  . Cough     chronic    There are no active problems to display for this patient.   Past Surgical History  Procedure Laterality Date  . Knee surgery    . Ankle surgery    . Axillary surgery    . Colonoscopy    . Cataract extraction w/phaco Right 01/16/2015    Procedure: CATARACT EXTRACTION PHACO AND INTRAOCULAR LENS PLACEMENT (IOC);  Surgeon: Leandrew Koyanagi, MD;  Location: [facility] ORS;  Service: Ophthalmology;  Laterality: Right;  [place]   1:36  AP     14.2 CDE  13.55 casette lot    [id number]    Allergies Dorzolamide; Lipitor; and Timolol  Social History Social History  Substance Use Topics  . Smoking status: Never Smoker   . Smokeless tobacco: None  . Alcohol Use: No    Review of Systems Constitutional: Negative for fever. Eyes: Negative for visual changes. ENT: Negative for sore throat. Cardiovascular: Positive for chest pain Respiratory: Negative for shortness of  breath. Gastrointestinal: Negative for abdominal pain, vomiting and diarrhea. Genitourinary: Negative for dysuria. Musculoskeletal: Negative for back pain. Skin: Negative for rash. Neurological: Positive for headache  10-point ROS otherwise negative.  ____________________________________________   PHYSICAL EXAM:  VITAL SIGNS: ED Triage Vitals  Enc Vitals Group     BP 04/30/15 1520 123/76 mmHg     Pulse Rate 04/30/15 1520 94     Resp 04/30/15 1520 18     Temp 04/30/15 1520 97.9 F (36.6 C)     Temp Source 04/30/15 1520 Oral     SpO2 04/30/15 1520 98 %     Weight 04/30/15 1520 141 lb (63.957 kg)     Height 04/30/15 1520 5\' 6"  (1.676 m)     Head Cir --      Peak Flow --      Pain Score 04/30/15 1521 7     Pain Loc --      Pain Edu? --      Excl. in [place]? --     Constitutional: Alert and oriented. Well appearing and in no distress. Eyes: Conjunctivae are normal. PERRL. Normal extraocular movements. ENT   Head: Normocephalic and atraumatic.   Nose: No congestion/rhinnorhea.   Mouth/Throat: Mucous membranes are moist.   Neck: No stridor. Cardiovascular: Normal rate, regular rhythm. No murmurs, rubs, or gallops. Respiratory: Normal respiratory effort without tachypnea nor retractions. Breath sounds  are clear and equal bilaterally. No wheezes/rales/rhonchi. Gastrointestinal: Soft and nontender. Normal bowel sounds Musculoskeletal: Nontender with normal range of motion in all extremities. No lower extremity tenderness nor edema. Neurologic:  Normal speech and language. No gross focal neurologic deficits are appreciated.  Skin:  Skin is warm, dry and intact. No rash noted. Psychiatric: Mood and affect are normal. Speech and behavior are normal.  ____________________________________________  EKG: Interpreted by me.Atrial fibrillation with a rate of 89 bpm, left axis deviation, normal QRS, normal QT interval.  ____________________________________________  ED  COURSE:  Pertinent labs & imaging results that were available during my care of the patient were reviewed by me and considered in my medical decision making (see chart for details). Patient is in no acute distress, will check basic labs, chest x-ray and reevaluate. I will also review his recent CT scan. ____________________________________________    LABS (pertinent positives/negatives)  Labs Reviewed  BASIC METABOLIC PANEL - Abnormal; Notable for the following:    Sodium 134 (*)    Chloride 97 (*)    Glucose, Bld 176 (*)    All other components within normal limits  CBC - Abnormal; Notable for the following:    RDW 15.7 (*)    All other components within normal limits  URINALYSIS COMPLETEWITH MICROSCOPIC ([facility] ONLY) - Abnormal; Notable for the following:    Color, Urine STRAW (*)    APPearance CLEAR (*)    Glucose, UA >500 (*)    Specific Gravity, Urine 1.000 (*)    All other components within normal limits  TROPONIN I  TROPONIN I    RADIOLOGY Images were viewed by me  CT head and chest x-ray IMPRESSION: Stable and negative for age noncontrast CT appearance of the brain. IMPRESSION: No active cardiopulmonary disease. ____________________________________________  FINAL ASSESSMENT AND PLAN  Chest pain, headache  Plan: Patient with labs and imaging as dictated above. Patient with essentially negative workup, troponin has been checked 2. He is currently taking Xarelto.He is stable for outpatient follow-up with his doctor.   Earleen Newport, MD   Earleen Newport, MD 04/30/15 [phone]

## 2015-04-30 NOTE — Discharge Instructions (Signed)
General Headache Without Cause °A headache is pain or discomfort felt around the head or neck area. The specific cause of a headache may not be found. There are many causes and types of headaches. A few common ones are: °· Tension headaches. °· Migraine headaches. °· Cluster headaches. °· Chronic daily headaches. °HOME CARE INSTRUCTIONS  °Watch your condition for any changes. Take these steps to help with your condition: °Managing Pain °· Take over-the-counter and prescription medicines only as told by your health care provider. °· Lie down in a dark, quiet room when you have a headache. °· If directed, apply ice to the head and neck area: °· Put ice in a plastic bag. °· Place a towel between your skin and the bag. °· Leave the ice on for 20 minutes, 2-3 times per day. °· Use a heating pad or hot shower to apply heat to the head and neck area as told by your health care provider. °· Keep lights dim if bright lights bother you or make your headaches worse. °Eating and Drinking °· Eat meals on a regular schedule. °· Limit alcohol use. °· Decrease the amount of caffeine you drink, or stop drinking caffeine. °General Instructions °· Keep all follow-up visits as told by your health care provider. This is important. °· Keep a headache journal to help find out what may trigger your headaches. For example, write down: °· What you eat and drink. °· How much sleep you get. °· Any change to your diet or medicines. °· Try massage or other relaxation techniques. °· Limit stress. °· Sit up straight, and do not tense your muscles. °· Do not use tobacco products, including cigarettes, chewing tobacco, or e-cigarettes. If you need help quitting, ask your health care provider. °· Exercise regularly as told by your health care provider. °· Sleep on a regular schedule. Get 7-9 hours of sleep, or the amount recommended by your health care provider. °SEEK MEDICAL CARE IF:  °· Your symptoms are not helped by medicine. °· You have a  headache that is different from the usual headache. °· You have nausea or you vomit. °· You have a fever. °SEEK IMMEDIATE MEDICAL CARE IF:  °· Your headache becomes severe. °· You have repeated vomiting. °· You have a stiff neck. °· You have a loss of vision. °· You have problems with speech. °· You have pain in the eye or ear. °· You have muscular weakness or loss of muscle control. °· You lose your balance or have trouble walking. °· You feel faint or pass out. °· You have confusion. °  °This information is not intended to replace advice given to you by your health care provider. Make sure you discuss any questions you have with your health care provider. °  °Document Released: 12/28/2004 Document Revised: 09/18/2014 Document Reviewed: 04/22/2014 °Elsevier Interactive Patient Education ©2016 [employer] ° °Nonspecific Chest Pain  °Chest pain can be caused by many different conditions. There is always a chance that your pain could be related to something serious, such as a heart attack or a blood clot in your lungs. Chest pain can also be caused by conditions that are not life-threatening. If you have chest pain, it is very important to follow up with your health care provider. °CAUSES  °Chest pain can be caused by: °· Heartburn. °· Pneumonia or bronchitis. °· Anxiety or stress. °· Inflammation around your heart (pericarditis) or lung (pleuritis or pleurisy). °· A blood clot in your lung. °· A collapsed   lung (pneumothorax). It can develop suddenly on its own (spontaneous pneumothorax) or from trauma to the chest. °· Shingles infection (varicella-zoster virus). °· Heart attack. °· Damage to the bones, muscles, and cartilage that make up your chest wall. This can include: °¨ Bruised bones due to injury. °¨ Strained muscles or cartilage due to frequent or repeated coughing or overwork. °¨ Fracture to one or more ribs. °¨ Sore cartilage due to inflammation (costochondritis). °RISK FACTORS  °Risk factors for chest  pain may include: °· Activities that increase your risk for trauma or injury to your chest. °· Respiratory infections or conditions that cause frequent coughing. °· Medical conditions or overeating that can cause heartburn. °· Heart disease or family history of heart disease. °· Conditions or health behaviors that increase your risk of developing a blood clot. °· Having had chicken pox (varicella zoster). °SIGNS AND SYMPTOMS °Chest pain can feel like: °· Burning or tingling on the surface of your chest or deep in your chest. °· Crushing, pressure, aching, or squeezing pain. °· Dull or sharp pain that is worse when you move, cough, or take a deep breath. °· Pain that is also felt in your back, neck, shoulder, or arm, or pain that spreads to any of these areas. °Your chest pain may come and go, or it may stay constant. °DIAGNOSIS °Lab tests or other studies may be needed to find the cause of your pain. Your health care provider may have you take a test called an ambulatory ECG (electrocardiogram). An ECG records your heartbeat patterns at the time the test is performed. You may also have other tests, such as: °· Transthoracic echocardiogram (TTE). During echocardiography, sound waves are used to create a picture of all of the heart structures and to look at how blood flows through your heart. °· Transesophageal echocardiogram (TEE). This is a more advanced imaging test that obtains images from inside your body. It allows your health care provider to see your heart in finer detail. °· Cardiac monitoring. This allows your health care provider to monitor your heart rate and rhythm in real time. °· Holter monitor. This is a portable device that records your heartbeat and can help to diagnose abnormal heartbeats. It allows your health care provider to track your heart activity for several days, if needed. °· Stress tests. These can be done through exercise or by taking medicine that makes your heart beat more  quickly. °· Blood tests. °· Imaging tests. °TREATMENT  °Your treatment depends on what is causing your chest pain. Treatment may include: °· Medicines. These may include: °¨ Acid blockers for heartburn. °¨ Anti-inflammatory medicine. °¨ Pain medicine for inflammatory conditions. °¨ Antibiotic medicine, if an infection is present. °¨ Medicines to dissolve blood clots. °¨ Medicines to treat coronary artery disease. °· Supportive care for conditions that do not require medicines. This may include: °¨ Resting. °¨ Applying heat or cold packs to injured areas. °¨ Limiting activities until pain decreases. °HOME CARE INSTRUCTIONS °· If you were prescribed an antibiotic medicine, finish it all even if you start to feel better. °· Avoid any activities that bring on chest pain. °· Do not use any tobacco products, including cigarettes, chewing tobacco, or electronic cigarettes. If you need help quitting, ask your health care provider. °· Do not drink alcohol. °· Take medicines only as directed by your health care provider. °· Keep all follow-up visits as directed by your health care provider. This is important. This includes any further testing if your chest pain   does not go away. °· If heartburn is the cause for your chest pain, you may be told to keep your head raised (elevated) while sleeping. This reduces the chance that acid will go from your stomach into your esophagus. °· Make lifestyle changes as directed by your health care provider. These may include: °¨ Getting regular exercise. Ask your health care provider to suggest some activities that are safe for you. °¨ Eating a heart-healthy diet. A registered dietitian can help you to learn healthy eating options. °¨ Maintaining a healthy weight. °¨ Managing diabetes, if necessary. °¨ Reducing stress. °SEEK MEDICAL CARE IF: °· Your chest pain does not go away after treatment. °· You have a rash with blisters on your chest. °· You have a fever. °SEEK IMMEDIATE MEDICAL CARE  IF:  °· Your chest pain is worse. °· You have an increasing cough, or you cough up blood. °· You have severe abdominal pain. °· You have severe weakness. °· You faint. °· You have chills. °· You have sudden, unexplained chest discomfort. °· You have sudden, unexplained discomfort in your arms, back, neck, or jaw. °· You have shortness of breath at any time. °· You suddenly start to sweat, or your skin gets clammy. °· You feel nauseous or you vomit. °· You suddenly feel light-headed or dizzy. °· Your heart begins to beat quickly, or it feels like it is skipping beats. °These symptoms may represent a serious problem that is an emergency. Do not wait to see if the symptoms will go away. Get medical help right away. Call your local emergency services (911 in the [place] Do not drive yourself to the hospital. °  °This information is not intended to replace advice given to you by your health care provider. Make sure you discuss any questions you have with your health care provider. °  °Document Released: 10/07/2004 Document Revised: 01/18/2014 Document Reviewed: 08/03/2013 °Elsevier Interactive Patient Education ©2016 Elsevier Inc. ° °

## 2015-05-01 DIAGNOSIS — I208 Other forms of angina pectoris: Secondary | ICD-10-CM | POA: Diagnosis not present

## 2015-05-01 DIAGNOSIS — I10 Essential (primary) hypertension: Secondary | ICD-10-CM | POA: Diagnosis not present

## 2015-05-01 DIAGNOSIS — I428 Other cardiomyopathies: Secondary | ICD-10-CM | POA: Diagnosis not present

## 2015-05-01 DIAGNOSIS — I482 Chronic atrial fibrillation: Secondary | ICD-10-CM | POA: Diagnosis not present

## 2015-05-07 DIAGNOSIS — I482 Chronic atrial fibrillation: Secondary | ICD-10-CM | POA: Diagnosis not present

## 2015-05-21 DIAGNOSIS — I071 Rheumatic tricuspid insufficiency: Secondary | ICD-10-CM | POA: Diagnosis not present

## 2015-05-21 DIAGNOSIS — I5022 Chronic systolic (congestive) heart failure: Secondary | ICD-10-CM | POA: Diagnosis not present

## 2015-05-21 DIAGNOSIS — I208 Other forms of angina pectoris: Secondary | ICD-10-CM | POA: Diagnosis not present

## 2015-05-21 DIAGNOSIS — I340 Nonrheumatic mitral (valve) insufficiency: Secondary | ICD-10-CM | POA: Diagnosis not present

## 2015-05-21 DIAGNOSIS — I482 Chronic atrial fibrillation: Secondary | ICD-10-CM | POA: Diagnosis not present

## 2015-06-04 DIAGNOSIS — M79642 Pain in left hand: Secondary | ICD-10-CM | POA: Diagnosis not present

## 2015-06-04 DIAGNOSIS — M150 Primary generalized (osteo)arthritis: Secondary | ICD-10-CM | POA: Diagnosis not present

## 2015-06-04 DIAGNOSIS — M1990 Unspecified osteoarthritis, unspecified site: Secondary | ICD-10-CM | POA: Diagnosis not present

## 2015-06-11 DIAGNOSIS — I10 Essential (primary) hypertension: Secondary | ICD-10-CM | POA: Diagnosis not present

## 2015-06-11 DIAGNOSIS — Z79899 Other long term (current) drug therapy: Secondary | ICD-10-CM | POA: Diagnosis not present

## 2015-06-11 DIAGNOSIS — M150 Primary generalized (osteo)arthritis: Secondary | ICD-10-CM | POA: Diagnosis not present

## 2015-06-11 DIAGNOSIS — I482 Chronic atrial fibrillation: Secondary | ICD-10-CM | POA: Diagnosis not present

## 2015-06-11 DIAGNOSIS — M1990 Unspecified osteoarthritis, unspecified site: Secondary | ICD-10-CM | POA: Diagnosis not present

## 2015-06-11 DIAGNOSIS — R4702 Dysphasia: Secondary | ICD-10-CM | POA: Diagnosis not present

## 2015-06-11 DIAGNOSIS — I5022 Chronic systolic (congestive) heart failure: Secondary | ICD-10-CM | POA: Diagnosis not present

## 2015-08-11 DIAGNOSIS — K219 Gastro-esophageal reflux disease without esophagitis: Secondary | ICD-10-CM | POA: Diagnosis not present

## 2015-08-11 DIAGNOSIS — I5022 Chronic systolic (congestive) heart failure: Secondary | ICD-10-CM | POA: Diagnosis not present

## 2015-08-11 DIAGNOSIS — I10 Essential (primary) hypertension: Secondary | ICD-10-CM | POA: Diagnosis not present

## 2015-08-11 DIAGNOSIS — I482 Chronic atrial fibrillation: Secondary | ICD-10-CM | POA: Diagnosis not present

## 2015-09-01 ENCOUNTER — Other Ambulatory Visit: Payer: Self-pay | Admitting: Internal Medicine

## 2015-09-01 DIAGNOSIS — I10 Essential (primary) hypertension: Secondary | ICD-10-CM | POA: Diagnosis not present

## 2015-09-01 DIAGNOSIS — R634 Abnormal weight loss: Secondary | ICD-10-CM

## 2015-09-01 DIAGNOSIS — Z79899 Other long term (current) drug therapy: Secondary | ICD-10-CM | POA: Diagnosis not present

## 2015-09-01 DIAGNOSIS — R410 Disorientation, unspecified: Secondary | ICD-10-CM | POA: Diagnosis not present

## 2015-09-01 DIAGNOSIS — G609 Hereditary and idiopathic neuropathy, unspecified: Secondary | ICD-10-CM | POA: Diagnosis not present

## 2015-09-01 DIAGNOSIS — K219 Gastro-esophageal reflux disease without esophagitis: Secondary | ICD-10-CM | POA: Diagnosis not present

## 2015-09-01 DIAGNOSIS — R7309 Other abnormal glucose: Secondary | ICD-10-CM | POA: Diagnosis not present

## 2015-09-01 DIAGNOSIS — E782 Mixed hyperlipidemia: Secondary | ICD-10-CM | POA: Diagnosis not present

## 2015-09-03 DIAGNOSIS — R7309 Other abnormal glucose: Secondary | ICD-10-CM | POA: Diagnosis not present

## 2015-09-03 DIAGNOSIS — Z79899 Other long term (current) drug therapy: Secondary | ICD-10-CM | POA: Diagnosis not present

## 2015-09-03 DIAGNOSIS — E782 Mixed hyperlipidemia: Secondary | ICD-10-CM | POA: Diagnosis not present

## 2015-09-03 DIAGNOSIS — H401132 Primary open-angle glaucoma, bilateral, moderate stage: Secondary | ICD-10-CM | POA: Diagnosis not present

## 2015-09-03 DIAGNOSIS — I10 Essential (primary) hypertension: Secondary | ICD-10-CM | POA: Diagnosis not present

## 2015-09-09 DIAGNOSIS — H401132 Primary open-angle glaucoma, bilateral, moderate stage: Secondary | ICD-10-CM | POA: Diagnosis not present

## 2015-09-30 DIAGNOSIS — F0280 Dementia in other diseases classified elsewhere without behavioral disturbance: Secondary | ICD-10-CM | POA: Insufficient documentation

## 2015-09-30 DIAGNOSIS — R410 Disorientation, unspecified: Secondary | ICD-10-CM | POA: Diagnosis not present

## 2015-09-30 DIAGNOSIS — G309 Alzheimer's disease, unspecified: Secondary | ICD-10-CM

## 2015-12-03 DIAGNOSIS — Z23 Encounter for immunization: Secondary | ICD-10-CM | POA: Diagnosis not present

## 2015-12-03 DIAGNOSIS — R7309 Other abnormal glucose: Secondary | ICD-10-CM | POA: Diagnosis not present

## 2015-12-03 DIAGNOSIS — E782 Mixed hyperlipidemia: Secondary | ICD-10-CM | POA: Diagnosis not present

## 2015-12-03 DIAGNOSIS — Z79899 Other long term (current) drug therapy: Secondary | ICD-10-CM | POA: Diagnosis not present

## 2015-12-03 DIAGNOSIS — Z125 Encounter for screening for malignant neoplasm of prostate: Secondary | ICD-10-CM | POA: Diagnosis not present

## 2015-12-03 DIAGNOSIS — I482 Chronic atrial fibrillation: Secondary | ICD-10-CM | POA: Diagnosis not present

## 2015-12-03 DIAGNOSIS — M1990 Unspecified osteoarthritis, unspecified site: Secondary | ICD-10-CM | POA: Diagnosis not present

## 2015-12-03 DIAGNOSIS — R69 Illness, unspecified: Secondary | ICD-10-CM | POA: Diagnosis not present

## 2015-12-03 DIAGNOSIS — I10 Essential (primary) hypertension: Secondary | ICD-10-CM | POA: Diagnosis not present

## 2015-12-22 DIAGNOSIS — M542 Cervicalgia: Secondary | ICD-10-CM | POA: Diagnosis not present

## 2015-12-22 DIAGNOSIS — K1121 Acute sialoadenitis: Secondary | ICD-10-CM | POA: Diagnosis not present

## 2015-12-24 DIAGNOSIS — D692 Other nonthrombocytopenic purpura: Secondary | ICD-10-CM | POA: Diagnosis not present

## 2015-12-24 DIAGNOSIS — L570 Actinic keratosis: Secondary | ICD-10-CM | POA: Diagnosis not present

## 2015-12-24 DIAGNOSIS — Z85828 Personal history of other malignant neoplasm of skin: Secondary | ICD-10-CM | POA: Diagnosis not present

## 2015-12-24 DIAGNOSIS — D1801 Hemangioma of skin and subcutaneous tissue: Secondary | ICD-10-CM | POA: Diagnosis not present

## 2015-12-24 DIAGNOSIS — L821 Other seborrheic keratosis: Secondary | ICD-10-CM | POA: Diagnosis not present

## 2016-02-19 DIAGNOSIS — I071 Rheumatic tricuspid insufficiency: Secondary | ICD-10-CM | POA: Diagnosis not present

## 2016-02-19 DIAGNOSIS — I5022 Chronic systolic (congestive) heart failure: Secondary | ICD-10-CM | POA: Diagnosis not present

## 2016-02-19 DIAGNOSIS — I10 Essential (primary) hypertension: Secondary | ICD-10-CM | POA: Diagnosis not present

## 2016-02-19 DIAGNOSIS — I482 Chronic atrial fibrillation: Secondary | ICD-10-CM | POA: Diagnosis not present

## 2016-02-26 DIAGNOSIS — R7309 Other abnormal glucose: Secondary | ICD-10-CM | POA: Diagnosis not present

## 2016-02-26 DIAGNOSIS — Z125 Encounter for screening for malignant neoplasm of prostate: Secondary | ICD-10-CM | POA: Diagnosis not present

## 2016-02-26 DIAGNOSIS — E782 Mixed hyperlipidemia: Secondary | ICD-10-CM | POA: Diagnosis not present

## 2016-02-26 DIAGNOSIS — I10 Essential (primary) hypertension: Secondary | ICD-10-CM | POA: Diagnosis not present

## 2016-02-26 DIAGNOSIS — Z79899 Other long term (current) drug therapy: Secondary | ICD-10-CM | POA: Diagnosis not present

## 2016-03-04 DIAGNOSIS — Z0000 Encounter for general adult medical examination without abnormal findings: Secondary | ICD-10-CM | POA: Diagnosis not present

## 2016-03-04 DIAGNOSIS — R972 Elevated prostate specific antigen [PSA]: Secondary | ICD-10-CM | POA: Diagnosis not present

## 2016-03-04 DIAGNOSIS — I5022 Chronic systolic (congestive) heart failure: Secondary | ICD-10-CM | POA: Diagnosis not present

## 2016-03-04 DIAGNOSIS — I10 Essential (primary) hypertension: Secondary | ICD-10-CM | POA: Diagnosis not present

## 2016-03-04 DIAGNOSIS — I482 Chronic atrial fibrillation: Secondary | ICD-10-CM | POA: Diagnosis not present

## 2016-03-04 DIAGNOSIS — R69 Illness, unspecified: Secondary | ICD-10-CM | POA: Diagnosis not present

## 2016-03-04 DIAGNOSIS — M1990 Unspecified osteoarthritis, unspecified site: Secondary | ICD-10-CM | POA: Diagnosis not present

## 2016-03-04 DIAGNOSIS — E782 Mixed hyperlipidemia: Secondary | ICD-10-CM | POA: Diagnosis not present

## 2016-03-08 DIAGNOSIS — H401132 Primary open-angle glaucoma, bilateral, moderate stage: Secondary | ICD-10-CM | POA: Diagnosis not present

## 2016-04-01 DIAGNOSIS — R972 Elevated prostate specific antigen [PSA]: Secondary | ICD-10-CM | POA: Diagnosis not present

## 2016-04-01 DIAGNOSIS — M7989 Other specified soft tissue disorders: Secondary | ICD-10-CM | POA: Diagnosis not present

## 2016-05-25 DIAGNOSIS — G301 Alzheimer's disease with late onset: Secondary | ICD-10-CM | POA: Diagnosis not present

## 2016-05-25 DIAGNOSIS — G4752 REM sleep behavior disorder: Secondary | ICD-10-CM | POA: Diagnosis not present

## 2016-05-25 DIAGNOSIS — R69 Illness, unspecified: Secondary | ICD-10-CM | POA: Diagnosis not present

## 2016-05-25 DIAGNOSIS — R259 Unspecified abnormal involuntary movements: Secondary | ICD-10-CM | POA: Diagnosis not present

## 2016-05-25 DIAGNOSIS — I482 Chronic atrial fibrillation: Secondary | ICD-10-CM | POA: Diagnosis not present

## 2016-06-01 ENCOUNTER — Other Ambulatory Visit
Admission: RE | Admit: 2016-06-01 | Discharge: 2016-06-01 | Disposition: A | Discharge status: Home / Self Care | Payer: Medicare HMO | Source: Ambulatory Visit | Attending: Rheumatology | Admitting: Rheumatology

## 2016-06-01 DIAGNOSIS — M1990 Unspecified osteoarthritis, unspecified site: Secondary | ICD-10-CM | POA: Diagnosis not present

## 2016-06-01 DIAGNOSIS — M150 Primary generalized (osteo)arthritis: Secondary | ICD-10-CM | POA: Diagnosis not present

## 2016-06-01 DIAGNOSIS — M25562 Pain in left knee: Secondary | ICD-10-CM | POA: Insufficient documentation

## 2016-06-01 LAB — BODY FLUID CELL COUNT WITH DIFFERENTIAL
EOS FL: 0 %
LYMPHS FL: 1 %
Monocyte-Macrophage-Serous Fluid: 36 %
NEUTROPHIL FLUID: 63 %
Other Cells, Fluid: 0 %
WBC FLUID: 851 uL

## 2016-06-01 LAB — SYNOVIAL FLUID, CRYSTAL: Crystals, Fluid: NONE SEEN

## 2016-06-02 DIAGNOSIS — R69 Illness, unspecified: Secondary | ICD-10-CM | POA: Diagnosis not present

## 2016-06-02 DIAGNOSIS — Z79899 Other long term (current) drug therapy: Secondary | ICD-10-CM | POA: Diagnosis not present

## 2016-06-02 DIAGNOSIS — I482 Chronic atrial fibrillation: Secondary | ICD-10-CM | POA: Diagnosis not present

## 2016-06-02 DIAGNOSIS — E782 Mixed hyperlipidemia: Secondary | ICD-10-CM | POA: Diagnosis not present

## 2016-06-02 DIAGNOSIS — Z125 Encounter for screening for malignant neoplasm of prostate: Secondary | ICD-10-CM | POA: Diagnosis not present

## 2016-06-02 DIAGNOSIS — I10 Essential (primary) hypertension: Secondary | ICD-10-CM | POA: Diagnosis not present

## 2016-08-17 DIAGNOSIS — I428 Other cardiomyopathies: Secondary | ICD-10-CM | POA: Diagnosis not present

## 2016-08-17 DIAGNOSIS — E782 Mixed hyperlipidemia: Secondary | ICD-10-CM | POA: Diagnosis not present

## 2016-08-17 DIAGNOSIS — I10 Essential (primary) hypertension: Secondary | ICD-10-CM | POA: Diagnosis not present

## 2016-08-17 DIAGNOSIS — I482 Chronic atrial fibrillation: Secondary | ICD-10-CM | POA: Diagnosis not present

## 2016-08-17 DIAGNOSIS — I340 Nonrheumatic mitral (valve) insufficiency: Secondary | ICD-10-CM | POA: Diagnosis not present

## 2016-08-17 DIAGNOSIS — I5022 Chronic systolic (congestive) heart failure: Secondary | ICD-10-CM | POA: Diagnosis not present

## 2016-08-17 DIAGNOSIS — I071 Rheumatic tricuspid insufficiency: Secondary | ICD-10-CM | POA: Diagnosis not present

## 2016-09-02 DIAGNOSIS — E782 Mixed hyperlipidemia: Secondary | ICD-10-CM | POA: Diagnosis not present

## 2016-09-02 DIAGNOSIS — Z125 Encounter for screening for malignant neoplasm of prostate: Secondary | ICD-10-CM | POA: Diagnosis not present

## 2016-09-02 DIAGNOSIS — I10 Essential (primary) hypertension: Secondary | ICD-10-CM | POA: Diagnosis not present

## 2016-09-02 DIAGNOSIS — Z79899 Other long term (current) drug therapy: Secondary | ICD-10-CM | POA: Diagnosis not present

## 2016-09-07 DIAGNOSIS — H401132 Primary open-angle glaucoma, bilateral, moderate stage: Secondary | ICD-10-CM | POA: Diagnosis not present

## 2016-09-09 DIAGNOSIS — E782 Mixed hyperlipidemia: Secondary | ICD-10-CM | POA: Diagnosis not present

## 2016-09-09 DIAGNOSIS — I10 Essential (primary) hypertension: Secondary | ICD-10-CM | POA: Diagnosis not present

## 2016-09-09 DIAGNOSIS — R7309 Other abnormal glucose: Secondary | ICD-10-CM | POA: Diagnosis not present

## 2016-09-09 DIAGNOSIS — I482 Chronic atrial fibrillation: Secondary | ICD-10-CM | POA: Diagnosis not present

## 2016-09-09 DIAGNOSIS — R69 Illness, unspecified: Secondary | ICD-10-CM | POA: Diagnosis not present

## 2016-09-14 DIAGNOSIS — H401132 Primary open-angle glaucoma, bilateral, moderate stage: Secondary | ICD-10-CM | POA: Diagnosis not present

## 2016-10-11 ENCOUNTER — Ambulatory Visit: Payer: Self-pay | Admitting: Family Medicine

## 2016-10-13 ENCOUNTER — Encounter: Payer: Self-pay | Admitting: Family Medicine

## 2016-10-13 ENCOUNTER — Other Ambulatory Visit
Admission: RE | Admit: 2016-10-13 | Discharge: 2016-10-13 | Disposition: A | Discharge status: Home / Self Care | Payer: Medicare HMO | Source: Ambulatory Visit | Attending: Family Medicine | Admitting: Family Medicine

## 2016-10-13 ENCOUNTER — Ambulatory Visit (INDEPENDENT_AMBULATORY_CARE_PROVIDER_SITE_OTHER): Payer: Medicare HMO | Admitting: Family Medicine

## 2016-10-13 VITALS — BP 122/82 | HR 97 | Resp 16 | Ht 66.0 in | Wt 131.4 lb

## 2016-10-13 DIAGNOSIS — R7303 Prediabetes: Secondary | ICD-10-CM | POA: Insufficient documentation

## 2016-10-13 DIAGNOSIS — N400 Enlarged prostate without lower urinary tract symptoms: Secondary | ICD-10-CM | POA: Diagnosis not present

## 2016-10-13 DIAGNOSIS — R351 Nocturia: Secondary | ICD-10-CM

## 2016-10-13 DIAGNOSIS — I110 Hypertensive heart disease with heart failure: Secondary | ICD-10-CM | POA: Diagnosis not present

## 2016-10-13 DIAGNOSIS — I5022 Chronic systolic (congestive) heart failure: Secondary | ICD-10-CM | POA: Insufficient documentation

## 2016-10-13 DIAGNOSIS — E559 Vitamin D deficiency, unspecified: Secondary | ICD-10-CM | POA: Insufficient documentation

## 2016-10-13 DIAGNOSIS — R110 Nausea: Secondary | ICD-10-CM | POA: Insufficient documentation

## 2016-10-13 DIAGNOSIS — E538 Deficiency of other specified B group vitamins: Secondary | ICD-10-CM

## 2016-10-13 DIAGNOSIS — H409 Unspecified glaucoma: Secondary | ICD-10-CM | POA: Insufficient documentation

## 2016-10-13 DIAGNOSIS — G309 Alzheimer's disease, unspecified: Secondary | ICD-10-CM | POA: Diagnosis not present

## 2016-10-13 DIAGNOSIS — E782 Mixed hyperlipidemia: Secondary | ICD-10-CM

## 2016-10-13 DIAGNOSIS — Z23 Encounter for immunization: Secondary | ICD-10-CM | POA: Diagnosis not present

## 2016-10-13 DIAGNOSIS — Z1331 Encounter for screening for depression: Secondary | ICD-10-CM | POA: Diagnosis not present

## 2016-10-13 DIAGNOSIS — R69 Illness, unspecified: Secondary | ICD-10-CM | POA: Diagnosis not present

## 2016-10-13 DIAGNOSIS — M1A9XX0 Chronic gout, unspecified, without tophus (tophi): Secondary | ICD-10-CM

## 2016-10-13 DIAGNOSIS — K219 Gastro-esophageal reflux disease without esophagitis: Secondary | ICD-10-CM | POA: Diagnosis not present

## 2016-10-13 DIAGNOSIS — G308 Other Alzheimer's disease: Secondary | ICD-10-CM | POA: Diagnosis not present

## 2016-10-13 DIAGNOSIS — F0281 Dementia in other diseases classified elsewhere with behavioral disturbance: Secondary | ICD-10-CM | POA: Diagnosis not present

## 2016-10-13 DIAGNOSIS — I482 Chronic atrial fibrillation, unspecified: Secondary | ICD-10-CM

## 2016-10-13 DIAGNOSIS — N401 Enlarged prostate with lower urinary tract symptoms: Secondary | ICD-10-CM | POA: Diagnosis not present

## 2016-10-13 DIAGNOSIS — M109 Gout, unspecified: Secondary | ICD-10-CM | POA: Insufficient documentation

## 2016-10-13 DIAGNOSIS — I10 Essential (primary) hypertension: Secondary | ICD-10-CM | POA: Diagnosis not present

## 2016-10-13 DIAGNOSIS — H25019 Cortical age-related cataract, unspecified eye: Secondary | ICD-10-CM | POA: Insufficient documentation

## 2016-10-13 LAB — HEMOGLOBIN A1C
Hgb A1c MFr Bld: 5.4 % (ref 4.8–5.6)
MEAN PLASMA GLUCOSE: 108.28 mg/dL

## 2016-10-13 LAB — URIC ACID: URIC ACID, SERUM: 4.2 mg/dL — AB (ref 4.4–7.6)

## 2016-10-13 LAB — TSH: TSH: 0.812 u[IU]/mL (ref 0.350–4.500)

## 2016-10-13 LAB — VITAMIN B12: VITAMIN B 12: 888 pg/mL (ref 180–914)

## 2016-10-13 MED ORDER — ZOSTER VAC RECOMB ADJUVANTED 50 MCG/0.5ML IM SUSR: 0.5000 mL | Freq: Once | INTRAMUSCULAR | 1 refills | Status: AC

## 2016-10-13 NOTE — Progress Notes (Signed)
Date:  10/13/2016   Name:  Glenn Garcia   DOB:  12-12-1935   MRN:  [id number]  PCP:  Idelle Crouch, MD    Chief Complaint: Advice Only (No referral in system but only wants to see [place] for Gariatrics unless happy then will transfer. )   History of Present Illness:  This is a 81 y.o. male seen for initial consultation. Sees Dr. Doy Hutching at [facility]. Wife concerned about progressive memory loss over past several years, saw neuro NP last year and dx'd with mild dementia, started on Aricept which seems to help with sleep and irritability, dose increased last month. Seroquel prescribed for agitation but wife reluctant to give after reading black box warning. Occ emotional outbursts, two sisters with dementia. Afib/CHF on Xarelto since 2013, followed by Dr. Nehemiah Massed, attempted to decrease amlodipine last month but felt worse so dose increased again, eye drops caused bradycardia in past. BPH on meds in past but caused edema, reports nocturia x 3, last PSA ok. GERD with HH, ENT has on daily Prilosec for recurrent hoarseness, no recent heartburn. Prediabetes in past, last a1c 5.3% in Feb.. Takes daily vit D and B12 qod. Gout on allopurinol, no recent flares. OA takes Tylenol prn avoids NSAIDS. Sleeping well per wife. Occ RLE edema, constipation, uses stool softeners. Father died 59 kidney ca, mother died 33 accident, had hallucinations, sibs with DM, EtOH, CVA, lymphoma, dementia. Pneumovax 11/2015, unsure about other imms, had colonoscopy 2017 with benign polyps, precancerous lesions in past. CMP/CBC/PSA ok last month, LDL 142  Review of Systems:  Review of Systems  Constitutional: Negative for appetite change, chills and fever.  HENT: Negative for ear pain, sinus pain and trouble swallowing.   Eyes: Negative for pain.  Respiratory: Negative for shortness of breath.   Cardiovascular: Negative for chest pain.  Gastrointestinal: Negative for abdominal pain.  Endocrine: Negative for polydipsia and polyuria.   Genitourinary: Negative for difficulty urinating.  Neurological: Negative for syncope and light-headedness.  Psychiatric/Behavioral: Negative for hallucinations.    Patient Active Problem List   Diagnosis Date Noted  . Cataract cortical, senile 10/13/2016  . Gout 10/13/2016  . B12 deficiency 10/13/2016  . Vitamin D deficiency 10/13/2016  . Alzheimer's dementia 09/30/2015  . Degenerative disc disease, cervical 02/12/2015  . Severe mitral insufficiency 01/01/2015  . Severe tricuspid valve insufficiency 07/01/2014  . Benign prostatic hyperplasia 04/08/2014  . Gastroesophageal reflux disease 04/08/2014  . Glaucoma 04/08/2014  . Hiatal hernia 04/08/2014  . Osteoarthritis 04/08/2014  . Peripheral neuropathy 04/08/2014  . Chronic systolic CHF (congestive heart failure), NYHA class 2 ([place]) 11/01/2013  . Essential hypertension 11/01/2013  . Hyperlipidemia, mixed 11/01/2013  . Cardiomyopathy ([place]) 10/04/2013  . Chronic atrial fibrillation ([place]) 10/04/2013  . Malignant neoplasm of skin 10/04/2013  . Low back pain 12/27/2011  . Peyronie's disease 12/27/2011  . Testicular hypofunction 12/27/2011    Prior to Admission medications   Medication Sig Start Date End Date Taking? Authorizing Provider  allopurinol (ZYLOPRIM) 100 MG tablet Take 100 mg by mouth daily.   Yes [provider]  amLODipine (NORVASC) 10 MG tablet Take 10 mg by mouth daily.   Yes [provider]  brimonidine (ALPHAGAN) 0.15 % ophthalmic solution Place 1 drop into the right eye 2 (two) times daily.   Yes [provider]  cholecalciferol (VITAMIN D) 1000 units tablet Take 2,000 Units by mouth daily.    Yes [provider]  latanoprost (XALATAN) 0.005 % ophthalmic solution Place 1 drop into  both eyes at bedtime.    Yes [provider]  omeprazole (PRILOSEC) 40 MG capsule Take 40 mg by mouth every evening.    Yes [provider]  rivaroxaban (XARELTO) 20 MG TABS tablet  Take 20 mg by mouth daily with supper.   Yes [provider]  vitamin B-12 (CYANOCOBALAMIN) 1000 MCG tablet Take 1,000 mcg by mouth daily.   Yes [provider]  Zoster Vac Recomb Adjuvanted [facility]) injection Inject 0.5 mLs into the muscle once. 10/13/16 10/13/16  Adline Potter, MD    Allergies  Allergen Reactions  . Dorzolamide Other (See Comments)    Reaction:  Decreased heart rate   . Timolol Other (See Comments)    Reaction:  Decreased heart rate     Past Surgical History:  Procedure Laterality Date  . ANKLE SURGERY    . ANKLE SURGERY    . AXILLARY SURGERY    . CATARACT EXTRACTION W/PHACO Right 01/16/2015   Procedure: CATARACT EXTRACTION PHACO AND INTRAOCULAR LENS PLACEMENT (IOC);  Surgeon: Leandrew Koyanagi, MD;  Location: [facility] ORS;  Service: Ophthalmology;  Laterality: Right;  [place]   1:36  AP     14.2 CDE  13.55 casette lot    #[id number]  . COLONOSCOPY    . KNEE SURGERY    . KNEE SURGERY      Social History  Substance Use Topics  . Smoking status: Never Smoker  . Smokeless tobacco: Never Used  . Alcohol use No    Family History  Problem Relation Age of Onset  . Cancer Father   . Cancer Sister   . Diabetes Sister   . Stroke Sister   . Early death Sister   . Diabetes Brother   . Alcohol abuse Brother   . Early death Brother     Medication list has been reviewed and updated.  Physical Examination: BP 122/82   Pulse 97   Resp 16   Ht 5\' 6"  (1.676 m)   Wt 131 lb 6.4 oz (59.6 kg)   SpO2 99%   BMI 21.21 kg/m   Physical Exam  Constitutional: He appears well-developed and well-nourished.  HENT:  Head: Normocephalic and atraumatic.  Right Ear: External ear normal.  Left Ear: External ear normal.  Nose: Nose normal.  Mouth/Throat: Oropharynx is clear and moist.  TMs clear  Eyes: Pupils are equal, round, and reactive to light. Conjunctivae and EOM are normal.  Neck: Neck supple. No thyromegaly present.  Cardiovascular: Normal rate and  normal heart sounds.   IRRR  Pulmonary/Chest: Effort normal and breath sounds normal.  Abdominal: Soft. He exhibits no distension and no mass. There is no tenderness.  Musculoskeletal:  Trace RLE edema  Lymphadenopathy:    He has no cervical adenopathy.  Neurological: He is alert.  SLUMS 8/30 Romberg negative, gait shuffling Mild intention tremor B hands  Skin: Skin is warm and dry.  Psychiatric: He has a normal mood and affect. His behavior is normal.  GDS 4/15  Nursing note and vitals reviewed.   Assessment and Plan:  1. Chronic atrial fibrillation (HCC) On Xarelto, cards following  2. Chronic systolic CHF (congestive heart failure), NYHA class 2 (HCC) Intolerant amlodipine taper, cards following  3. Alzheimer's dementia with behavioral disturbance, unspecified timing of dementia onset Moderate severity, recheck labs, Aricept seems to help sleep and irritability, consider adding memantine - RPR - TSH  4. Essential hypertension Well controlled on amlodipiine  5. Gastroesophageal reflux disease, esophagitis presence not specified On daily PPI  per ENT, consider decreased dose  6. Benign prostatic hyperplasia with nocturia Intolerant Flomax, consider Proscar  7. Hyperlipidemia, mixed Intolerant Lipitor in past, consider Pravachol  8. Chronic gout without tophus, unspecified cause, unspecified site Well controlled on allopurinol - Uric acid  9. Prediabetes - HgB A1c  10. B12 deficiency - B12  11. Vitamin D deficiency - Vitamin D (25 hydroxy)  12. Positive depression screening GDS marginal, consider Remeron  13. Need for vaccination for pneumococcus - Pneumococcal conjugate vaccine 13-valent  14. Need for influenza vaccination - Flu vaccine HIGH DOSE PF (Fluzone High dose)  15. Need for zoster vaccination - Zoster Vac Recomb Adjuvanted [facility]) injection; Inject 0.5 mLs into the muscle once.  Dispense: 0.5 mL; Refill: 1  16. HM Consider Tdap next  visit, wife to arrange podiatry visit for nail care, no indication for f/u colonoscopy  Return in about 4 weeks (around 11/10/2016).   One hour spent with patient/wife, over half in counseling  Satira Anis. Levonia Wolfley, [facility]  10/13/2016

## 2016-10-14 ENCOUNTER — Other Ambulatory Visit: Payer: Self-pay | Admitting: Family Medicine

## 2016-10-14 LAB — RPR: RPR Ser Ql: NONREACTIVE

## 2016-10-14 LAB — VITAMIN D 25 HYDROXY (VIT D DEFICIENCY, FRACTURES): VIT D 25 HYDROXY: 25.4 ng/mL — AB (ref 30.0–100.0)

## 2016-10-14 MED ORDER — VITAMIN D3 125 MCG (5000 UT) PO CAPS: 1.0000 | ORAL_CAPSULE | Freq: Every day | ORAL | Status: AC

## 2016-10-14 MED ORDER — DONEPEZIL HCL 10 MG PO TABS: 10.0000 mg | ORAL_TABLET | Freq: Every day | ORAL | Status: AC

## 2016-10-14 MED ORDER — MEMANTINE HCL 5 MG PO TABS: 5.0000 mg | ORAL_TABLET | Freq: Two times a day (BID) | ORAL | 2 refills | Status: DC

## 2016-11-17 ENCOUNTER — Ambulatory Visit: Payer: Medicare HMO | Admitting: Family Medicine

## 2016-11-17 ENCOUNTER — Encounter: Payer: Self-pay | Admitting: Family Medicine

## 2016-11-17 VITALS — BP 122/80 | HR 78 | Resp 16 | Ht 66.0 in | Wt 135.0 lb

## 2016-11-17 DIAGNOSIS — Z23 Encounter for immunization: Secondary | ICD-10-CM | POA: Diagnosis not present

## 2016-11-17 DIAGNOSIS — I482 Chronic atrial fibrillation, unspecified: Secondary | ICD-10-CM

## 2016-11-17 DIAGNOSIS — F02818 Dementia in other diseases classified elsewhere, unspecified severity, with other behavioral disturbance: Secondary | ICD-10-CM

## 2016-11-17 DIAGNOSIS — K219 Gastro-esophageal reflux disease without esophagitis: Secondary | ICD-10-CM | POA: Diagnosis not present

## 2016-11-17 DIAGNOSIS — I5022 Chronic systolic (congestive) heart failure: Secondary | ICD-10-CM | POA: Diagnosis not present

## 2016-11-17 DIAGNOSIS — F0281 Dementia in other diseases classified elsewhere with behavioral disturbance: Secondary | ICD-10-CM

## 2016-11-17 DIAGNOSIS — I10 Essential (primary) hypertension: Secondary | ICD-10-CM | POA: Diagnosis not present

## 2016-11-17 DIAGNOSIS — E559 Vitamin D deficiency, unspecified: Secondary | ICD-10-CM

## 2016-11-17 DIAGNOSIS — F32A Depression, unspecified: Secondary | ICD-10-CM

## 2016-11-17 DIAGNOSIS — M1A9XX0 Chronic gout, unspecified, without tophus (tophi): Secondary | ICD-10-CM

## 2016-11-17 DIAGNOSIS — E538 Deficiency of other specified B group vitamins: Secondary | ICD-10-CM | POA: Diagnosis not present

## 2016-11-17 DIAGNOSIS — E782 Mixed hyperlipidemia: Secondary | ICD-10-CM | POA: Diagnosis not present

## 2016-11-17 DIAGNOSIS — G301 Alzheimer's disease with late onset: Secondary | ICD-10-CM

## 2016-11-17 DIAGNOSIS — F329 Major depressive disorder, single episode, unspecified: Secondary | ICD-10-CM

## 2016-11-17 DIAGNOSIS — R69 Illness, unspecified: Secondary | ICD-10-CM | POA: Diagnosis not present

## 2016-11-17 MED ORDER — MIRTAZAPINE 15 MG PO TABS: 15.0000 mg | ORAL_TABLET | Freq: Every day | ORAL | 2 refills | Status: AC

## 2016-11-17 NOTE — Progress Notes (Signed)
Date:  11/17/2016   Name:  Glenn Garcia   DOB:  26-May-1935   MRN:  [id number]  PCP:  Idelle Crouch, MD    Chief Complaint: Atrial Fibrillation (follow up ) and Drooling (Had taken Atropine 1% opth drops but used on tongue written as eye drop to help cover with insurance. )   History of Present Illness:  This is a 81 y.o. male seen in one month f/u from initial consultation. Blood work ok except low vit D, supplement increased. Namenda started but caused daytime sedation even at half dose and no clear cognitive benefit. Afib/CHF stable, no increased LE edema, had one episode CP last week but resolved with rest. Denies heartburn, appetite good, plans to see podiatry and dentist soon. Neurology gave atropine gtts for intermittent drooling but wife reluctant to use, only an issue 2-3 times/week. Gets up frequently at night, wife asks about melatonin. Needs Tdap.  Review of Systems:  Review of Systems  Constitutional: Negative for chills and fever.  Respiratory: Negative for cough and shortness of breath.   Gastrointestinal: Negative for abdominal pain, constipation and diarrhea.  Genitourinary: Negative for difficulty urinating.  Neurological: Negative for syncope and light-headedness.    Patient Active Problem List   Diagnosis Date Noted  . Depression 11/17/2016  . Cataract cortical, senile 10/13/2016  . Gout 10/13/2016  . B12 deficiency 10/13/2016  . Vitamin D deficiency 10/13/2016  . Alzheimer's dementia 09/30/2015  . Degenerative disc disease, cervical 02/12/2015  . Severe mitral insufficiency 01/01/2015  . Severe tricuspid valve insufficiency 07/01/2014  . Benign prostatic hyperplasia 04/08/2014  . Gastroesophageal reflux disease 04/08/2014  . Glaucoma 04/08/2014  . Hiatal hernia 04/08/2014  . Osteoarthritis 04/08/2014  . Peripheral neuropathy 04/08/2014  . Chronic systolic CHF (congestive heart failure), NYHA class 2 ([place]) 11/01/2013  . Essential hypertension  11/01/2013  . Hyperlipidemia, mixed 11/01/2013  . Cardiomyopathy ([place]) 10/04/2013  . Chronic atrial fibrillation ([place]) 10/04/2013  . Malignant neoplasm of skin 10/04/2013  . Low back pain 12/27/2011  . Peyronie's disease 12/27/2011  . Testicular hypofunction 12/27/2011    Prior to Admission medications   Medication Sig Start Date End Date Taking? Authorizing Provider  acetaminophen (TYLENOL) 500 MG tablet Take 500 mg by mouth every 6 (six) hours as needed.   Yes [provider]  allopurinol (ZYLOPRIM) 100 MG tablet Take 100 mg by mouth daily.   Yes [provider]  amLODipine (NORVASC) 10 MG tablet Take 10 mg by mouth daily.   Yes [provider]  brimonidine (ALPHAGAN) 0.15 % ophthalmic solution Place 1 drop into the right eye 2 (two) times daily.   Yes [provider]  Cholecalciferol (VITAMIN D3) 5000 units CAPS Take 1 capsule (5,000 Units total) by mouth daily. 10/14/16  Yes Jhoel Stieg, Gwyndolyn Saxon, MD  donepezil (ARICEPT) 10 MG tablet Take 1 tablet (10 mg total) by mouth at bedtime. 10/14/16  Yes Yakub Lodes, Gwyndolyn Saxon, MD  latanoprost (XALATAN) 0.005 % ophthalmic solution Place 1 drop into both eyes at bedtime.    Yes [provider]  omeprazole (PRILOSEC) 40 MG capsule Take 40 mg by mouth every evening.    Yes [provider]  rivaroxaban (XARELTO) 20 MG TABS tablet Take 20 mg by mouth daily with supper.   Yes [provider]  vitamin B-12 (CYANOCOBALAMIN) 1000 MCG tablet Take 1,000 mcg by mouth daily.   Yes [provider]  mirtazapine (REMERON) 15 MG tablet Take 1 tablet (15 mg total) at bedtime by mouth.  11/17/16   Adline Potter, MD    Allergies  Allergen Reactions  . Ciprofloxacin Hcl   . Dorzolamide Other (See Comments)    Reaction:  Decreased heart rate   . Lipitor [Atorvastatin] Other (See Comments)    Reaction:  Muscle pain   . Timolol Other (See Comments)    Reaction:  Decreased heart rate     Past Surgical  History:  Procedure Laterality Date  . ANKLE SURGERY    . ANKLE SURGERY    . AXILLARY SURGERY    . COLONOSCOPY    . KNEE SURGERY    . KNEE SURGERY      Social History   Tobacco Use  . Smoking status: Never Smoker  . Smokeless tobacco: Never Used  Substance Use Topics  . Alcohol use: No  . Drug use: No    Family History  Problem Relation Age of Onset  . Cancer Father   . Cancer Sister   . Diabetes Sister   . Stroke Sister   . Early death Sister   . Diabetes Brother   . Alcohol abuse Brother   . Early death Brother     Medication list has been reviewed and updated.  Physical Examination: BP 122/80   Pulse 78   Resp 16   Ht 5\' 6"  (1.676 m)   Wt 135 lb (61.2 kg)   SpO2 98%   BMI 21.79 kg/m   Physical Exam  Constitutional: He appears well-developed and well-nourished.  Cardiovascular: Normal rate, regular rhythm and normal heart sounds.  Pulmonary/Chest: Effort normal and breath sounds normal.  Musculoskeletal:  Trace BLE edema  Neurological: He is alert.  Skin: Skin is warm and dry.  Psychiatric: He has a normal mood and affect. His behavior is normal.  Nursing note and vitals reviewed.   Assessment and Plan:  1. Late onset Alzheimer's disease with behavioral disturbance Stable, cont Aricept, d/c Namenda given persistent sedation  2. Depression, unspecified depression type Begin Remeron qhs, may also help with sleep  3. Chronic systolic CHF (congestive heart failure), NYHA class 2 (HCC) Stable, cards following  4. Chronic atrial fibrillation (HCC) Stable on Xarelto  5. Gastroesophageal reflux disease, esophagitis presence not specified On Prilosec long term per ENT for recurrent hoarseness  6. Essential hypertension Well controlled on amlodipine, intolerant taper  7. Chronic gout without tophus, unspecified cause, unspecified site Well controlled on allopurinol  8. Hyperlipidemia, mixed Intolerant Lipitor, consider Pravachol  9. Vitamin D  deficiency On increased supplement, consider repeat level next visit  10. B12 deficiency Well controlled on supplement  11. Need for diphtheria-tetanus-pertussis (Tdap) vaccine - Tdap vaccine greater than or equal to 7yo IM  Return in about 4 weeks (around 12/15/2016).   Plan discharge back to PCP then.  Satira Anis. [facility]  11/17/2016

## 2016-12-15 ENCOUNTER — Ambulatory Visit: Payer: Medicare HMO | Admitting: Family Medicine

## 2016-12-15 ENCOUNTER — Encounter: Payer: Self-pay | Admitting: Family Medicine

## 2016-12-15 VITALS — BP 102/68 | HR 95 | Resp 16 | Ht 66.0 in | Wt 135.0 lb

## 2016-12-15 DIAGNOSIS — G301 Alzheimer's disease with late onset: Secondary | ICD-10-CM | POA: Diagnosis not present

## 2016-12-15 DIAGNOSIS — N401 Enlarged prostate with lower urinary tract symptoms: Secondary | ICD-10-CM

## 2016-12-15 DIAGNOSIS — M1A9XX0 Chronic gout, unspecified, without tophus (tophi): Secondary | ICD-10-CM | POA: Diagnosis not present

## 2016-12-15 DIAGNOSIS — I482 Chronic atrial fibrillation, unspecified: Secondary | ICD-10-CM

## 2016-12-15 DIAGNOSIS — F02818 Dementia in other diseases classified elsewhere, unspecified severity, with other behavioral disturbance: Secondary | ICD-10-CM

## 2016-12-15 DIAGNOSIS — E559 Vitamin D deficiency, unspecified: Secondary | ICD-10-CM

## 2016-12-15 DIAGNOSIS — F329 Major depressive disorder, single episode, unspecified: Secondary | ICD-10-CM | POA: Diagnosis not present

## 2016-12-15 DIAGNOSIS — G4762 Sleep related leg cramps: Secondary | ICD-10-CM | POA: Insufficient documentation

## 2016-12-15 DIAGNOSIS — E538 Deficiency of other specified B group vitamins: Secondary | ICD-10-CM | POA: Diagnosis not present

## 2016-12-15 DIAGNOSIS — E782 Mixed hyperlipidemia: Secondary | ICD-10-CM | POA: Diagnosis not present

## 2016-12-15 DIAGNOSIS — F0281 Dementia in other diseases classified elsewhere with behavioral disturbance: Secondary | ICD-10-CM

## 2016-12-15 DIAGNOSIS — F32A Depression, unspecified: Secondary | ICD-10-CM

## 2016-12-15 DIAGNOSIS — R351 Nocturia: Secondary | ICD-10-CM

## 2016-12-15 DIAGNOSIS — I10 Essential (primary) hypertension: Secondary | ICD-10-CM

## 2016-12-15 DIAGNOSIS — I5022 Chronic systolic (congestive) heart failure: Secondary | ICD-10-CM | POA: Diagnosis not present

## 2016-12-15 DIAGNOSIS — R69 Illness, unspecified: Secondary | ICD-10-CM | POA: Diagnosis not present

## 2016-12-15 DIAGNOSIS — K219 Gastro-esophageal reflux disease without esophagitis: Secondary | ICD-10-CM | POA: Diagnosis not present

## 2016-12-15 NOTE — Progress Notes (Signed)
Date:  12/15/2016   Name:  Glenn Garcia   DOB:  [birth year]   MRN:  [id number]  PCP:  Adline Potter, MD    Chief Complaint: Atrial Fibrillation and Spasms (Leg cramps in R leg )   History of Present Illness:  This is a 81 y.o. male seen for second f/u from initial consultation in October. Placed on Remeron last visit, wife feels mood swings and drooling improved. Still driving but not at night or unfamiliar places. Also c/o intermittent nocturnal leg cramps. CHF/Afib stable, having some breakthrough GERD sxs but mostly diet related. On increased vitamin D supplement. Still having nocturia x 3.  Review of Systems:  Review of Systems  Constitutional: Negative for chills and fever.  Respiratory: Negative for cough and shortness of breath.   Cardiovascular: Negative for chest pain and leg swelling.  Genitourinary: Negative for difficulty urinating.  Neurological: Negative for syncope and light-headedness.    Patient Active Problem List   Diagnosis Date Noted  . Nocturnal leg cramps 12/15/2016  . Depression 11/17/2016  . Cataract cortical, senile 10/13/2016  . Gout 10/13/2016  . B12 deficiency 10/13/2016  . Vitamin D deficiency 10/13/2016  . Alzheimer's dementia 09/30/2015  . Degenerative disc disease, cervical 02/12/2015  . Severe mitral insufficiency 01/01/2015  . Severe tricuspid valve insufficiency 07/01/2014  . Benign prostatic hyperplasia 04/08/2014  . Gastroesophageal reflux disease 04/08/2014  . Glaucoma 04/08/2014  . Hiatal hernia 04/08/2014  . Osteoarthritis 04/08/2014  . Peripheral neuropathy 04/08/2014  . Chronic systolic CHF (congestive heart failure), NYHA class 2 ([place]) 11/01/2013  . Essential hypertension 11/01/2013  . Hyperlipidemia, mixed 11/01/2013  . Cardiomyopathy ([place]) 10/04/2013  . Chronic atrial fibrillation ([place]) 10/04/2013  . Malignant neoplasm of skin 10/04/2013  . Low back pain 12/27/2011  . Peyronie's disease 12/27/2011  . Testicular  hypofunction 12/27/2011    Prior to Admission medications   Medication Sig Start Date End Date Taking? Authorizing Provider  acetaminophen (TYLENOL) 500 MG tablet Take 1,000 mg by mouth every 8 (eight) hours as needed.   Yes [provider]  allopurinol (ZYLOPRIM) 100 MG tablet Take 100 mg by mouth daily.   Yes [provider]  amLODipine (NORVASC) 10 MG tablet Take 10 mg by mouth daily.   Yes [provider]  brimonidine (ALPHAGAN) 0.15 % ophthalmic solution Place 1 drop into the right eye 2 (two) times daily.   Yes [provider]  Cholecalciferol (VITAMIN D3) 5000 units CAPS Take 1 capsule (5,000 Units total) by mouth daily. 10/14/16  Yes Byan Poplaski, Gwyndolyn Saxon, MD  donepezil (ARICEPT) 10 MG tablet Take 1 tablet (10 mg total) by mouth at bedtime. 10/14/16  Yes Shahzad Thomann, Gwyndolyn Saxon, MD  latanoprost (XALATAN) 0.005 % ophthalmic solution Place 1 drop into both eyes at bedtime.    Yes [provider]  mirtazapine (REMERON) 15 MG tablet Take 1 tablet (15 mg total) at bedtime by mouth. 11/17/16  Yes Wilhemenia Camba, Gwyndolyn Saxon, MD  omeprazole (PRILOSEC) 40 MG capsule Take 40 mg by mouth every evening.    Yes [provider]  rivaroxaban (XARELTO) 20 MG TABS tablet Take 20 mg by mouth daily with supper.   Yes [provider]  vitamin B-12 (CYANOCOBALAMIN) 1000 MCG tablet Take 1,000 mcg by mouth daily.   Yes [provider]    Allergies  Allergen Reactions  . Ciprofloxacin Hcl   . Dorzolamide Other (See Comments)    Reaction:  Decreased heart rate   . Lipitor [Atorvastatin] Other (See Comments)  Reaction:  Muscle pain   . Timolol Other (See Comments)    Reaction:  Decreased heart rate     Past Surgical History:  Procedure Laterality Date  . ANKLE SURGERY    . ANKLE SURGERY    . AXILLARY SURGERY    . CATARACT EXTRACTION W/PHACO Right 01/16/2015   Procedure: CATARACT EXTRACTION PHACO AND INTRAOCULAR LENS PLACEMENT (IOC);  Surgeon: Leandrew Koyanagi, MD;  Location: [facility] ORS;  Service: Ophthalmology;  Laterality: Right;  [place]   1:36  AP     14.2 CDE  13.55 casette lot    #[id number] H  . COLONOSCOPY    . KNEE SURGERY    . KNEE SURGERY      Social History   Tobacco Use  . Smoking status: Never Smoker  . Smokeless tobacco: Never Used  Substance Use Topics  . Alcohol use: No  . Drug use: No    Family History  Problem Relation Age of Onset  . Cancer Father   . Cancer Sister   . Diabetes Sister   . Stroke Sister   . Early death Sister   . Diabetes Brother   . Alcohol abuse Brother   . Early death Brother     Medication list has been reviewed and updated.  Physical Examination: BP 102/68   Pulse 95   Resp 16   Ht '5\' 6"'  (1.676 m)   Wt 135 lb (61.2 kg)   SpO2 96%   BMI 21.79 kg/m   Physical Exam  Constitutional: He appears well-developed and well-nourished.  Cardiovascular: Normal rate, regular rhythm and normal heart sounds.  Pulmonary/Chest: Effort normal and breath sounds normal.  Musculoskeletal: He exhibits no edema.  Neurological: He is alert.  Skin: Skin is warm and dry.  Psychiatric: He has a normal mood and affect. His behavior is normal.  Nursing note and vitals reviewed.   Assessment and Plan:  1. Chronic systolic CHF (congestive heart failure), NYHA class 2 (HCC) Well compensated, cards following  2. Chronic atrial fibrillation (HCC) Stable on Xarelto, cards following  3. Late onset Alzheimer's disease with behavioral disturbance Stable on Aricept, intolerant memantine  4. Depression, unspecified depression type Improved on Remeron  5. Essential hypertension Overcontrolled today but asymptomatic and intolerant amlodipine taper in past  6. Nocturnal leg cramps OTC therapies discussed - Magnesium - Sed Rate (ESR)  7. Benign prostatic hyperplasia with nocturia Intolerant Flomax, consider Proscar  8. Gastroesophageal reflux disease, esophagitis presence not specified Stable on  omeprazole, continue per ENT  9. Chronic gout without tophus, unspecified cause, unspecified site Well controlled on allopurinol  10. Hyperlipidemia, mixed Intolerant Lip[itor, consider Pravachol (fewer side effects in elderly)  11. B12 deficiency Well controlled on supplement  12. Vitamin D deficiency On increased supplement - Vitamin D (25 hydroxy)  Return if symptoms worsen or fail to improve, for discharge back to PCP care.   I am available to see in re-consultation as needed.  Satira Anis. [facility]  12/15/2016

## 2016-12-16 LAB — MAGNESIUM: MAGNESIUM: 2.2 mg/dL (ref 1.6–2.3)

## 2016-12-16 LAB — SEDIMENTATION RATE: Sed Rate: 14 mm/hr (ref 0–30)

## 2016-12-16 LAB — VITAMIN D 25 HYDROXY (VIT D DEFICIENCY, FRACTURES): Vit D, 25-Hydroxy: 23.7 ng/mL — ABNORMAL LOW (ref 30.0–100.0)

## 2016-12-29 DIAGNOSIS — R69 Illness, unspecified: Secondary | ICD-10-CM | POA: Diagnosis not present

## 2016-12-29 DIAGNOSIS — I10 Essential (primary) hypertension: Secondary | ICD-10-CM | POA: Diagnosis not present

## 2016-12-29 DIAGNOSIS — E782 Mixed hyperlipidemia: Secondary | ICD-10-CM | POA: Diagnosis not present

## 2016-12-29 DIAGNOSIS — R110 Nausea: Secondary | ICD-10-CM | POA: Diagnosis not present

## 2017-01-18 DIAGNOSIS — I071 Rheumatic tricuspid insufficiency: Secondary | ICD-10-CM | POA: Diagnosis not present

## 2017-01-18 DIAGNOSIS — I5022 Chronic systolic (congestive) heart failure: Secondary | ICD-10-CM | POA: Diagnosis not present

## 2017-01-18 DIAGNOSIS — I428 Other cardiomyopathies: Secondary | ICD-10-CM | POA: Diagnosis not present

## 2017-01-18 DIAGNOSIS — E782 Mixed hyperlipidemia: Secondary | ICD-10-CM | POA: Diagnosis not present

## 2017-01-18 DIAGNOSIS — I10 Essential (primary) hypertension: Secondary | ICD-10-CM | POA: Diagnosis not present

## 2017-01-18 DIAGNOSIS — I482 Chronic atrial fibrillation: Secondary | ICD-10-CM | POA: Diagnosis not present

## 2017-01-18 DIAGNOSIS — I340 Nonrheumatic mitral (valve) insufficiency: Secondary | ICD-10-CM | POA: Diagnosis not present

## 2017-02-02 DIAGNOSIS — L821 Other seborrheic keratosis: Secondary | ICD-10-CM | POA: Diagnosis not present

## 2017-02-02 DIAGNOSIS — L570 Actinic keratosis: Secondary | ICD-10-CM | POA: Diagnosis not present

## 2017-02-02 DIAGNOSIS — Z85828 Personal history of other malignant neoplasm of skin: Secondary | ICD-10-CM | POA: Diagnosis not present

## 2017-03-30 DIAGNOSIS — I5022 Chronic systolic (congestive) heart failure: Secondary | ICD-10-CM | POA: Diagnosis not present

## 2017-03-30 DIAGNOSIS — E782 Mixed hyperlipidemia: Secondary | ICD-10-CM | POA: Diagnosis not present

## 2017-03-30 DIAGNOSIS — Z79899 Other long term (current) drug therapy: Secondary | ICD-10-CM | POA: Diagnosis not present

## 2017-03-30 DIAGNOSIS — R69 Illness, unspecified: Secondary | ICD-10-CM | POA: Diagnosis not present

## 2017-03-30 DIAGNOSIS — R490 Dysphonia: Secondary | ICD-10-CM | POA: Diagnosis not present

## 2017-03-30 DIAGNOSIS — I10 Essential (primary) hypertension: Secondary | ICD-10-CM | POA: Diagnosis not present

## 2017-03-30 DIAGNOSIS — Z0000 Encounter for general adult medical examination without abnormal findings: Secondary | ICD-10-CM | POA: Diagnosis not present

## 2017-04-01 DIAGNOSIS — H401132 Primary open-angle glaucoma, bilateral, moderate stage: Secondary | ICD-10-CM | POA: Diagnosis not present

## 2017-04-08 DIAGNOSIS — K219 Gastro-esophageal reflux disease without esophagitis: Secondary | ICD-10-CM | POA: Diagnosis not present

## 2017-04-08 DIAGNOSIS — R490 Dysphonia: Secondary | ICD-10-CM | POA: Diagnosis not present

## 2017-07-20 ENCOUNTER — Encounter: Payer: Self-pay | Admitting: *Deleted

## 2017-07-20 ENCOUNTER — Emergency Department: Payer: Medicare HMO

## 2017-07-20 ENCOUNTER — Emergency Department
Admission: EM | Admit: 2017-07-20 | Discharge: 2017-07-21 | Disposition: A | Discharge status: Home / Self Care | Payer: Medicare HMO | Attending: Emergency Medicine | Admitting: Emergency Medicine

## 2017-07-20 ENCOUNTER — Other Ambulatory Visit: Payer: Self-pay

## 2017-07-20 DIAGNOSIS — Z79899 Other long term (current) drug therapy: Secondary | ICD-10-CM | POA: Insufficient documentation

## 2017-07-20 DIAGNOSIS — M25561 Pain in right knee: Secondary | ICD-10-CM | POA: Diagnosis not present

## 2017-07-20 DIAGNOSIS — S199XXA Unspecified injury of neck, initial encounter: Secondary | ICD-10-CM | POA: Diagnosis not present

## 2017-07-20 DIAGNOSIS — F0280 Dementia in other diseases classified elsewhere without behavioral disturbance: Secondary | ICD-10-CM | POA: Diagnosis not present

## 2017-07-20 DIAGNOSIS — Z7901 Long term (current) use of anticoagulants: Secondary | ICD-10-CM | POA: Diagnosis not present

## 2017-07-20 DIAGNOSIS — M549 Dorsalgia, unspecified: Secondary | ICD-10-CM | POA: Diagnosis not present

## 2017-07-20 DIAGNOSIS — I5022 Chronic systolic (congestive) heart failure: Secondary | ICD-10-CM | POA: Diagnosis not present

## 2017-07-20 DIAGNOSIS — W19XXXA Unspecified fall, initial encounter: Secondary | ICD-10-CM | POA: Diagnosis not present

## 2017-07-20 DIAGNOSIS — R69 Illness, unspecified: Secondary | ICD-10-CM | POA: Diagnosis not present

## 2017-07-20 DIAGNOSIS — Y998 Other external cause status: Secondary | ICD-10-CM | POA: Insufficient documentation

## 2017-07-20 DIAGNOSIS — G309 Alzheimer's disease, unspecified: Secondary | ICD-10-CM | POA: Diagnosis not present

## 2017-07-20 DIAGNOSIS — S8991XA Unspecified injury of right lower leg, initial encounter: Secondary | ICD-10-CM | POA: Diagnosis not present

## 2017-07-20 DIAGNOSIS — Y92008 Other place in unspecified non-institutional (private) residence as the place of occurrence of the external cause: Secondary | ICD-10-CM | POA: Diagnosis not present

## 2017-07-20 DIAGNOSIS — I110 Hypertensive heart disease with heart failure: Secondary | ICD-10-CM | POA: Insufficient documentation

## 2017-07-20 DIAGNOSIS — Y9301 Activity, walking, marching and hiking: Secondary | ICD-10-CM | POA: Diagnosis not present

## 2017-07-20 DIAGNOSIS — W07XXXA Fall from chair, initial encounter: Secondary | ICD-10-CM | POA: Insufficient documentation

## 2017-07-20 DIAGNOSIS — S12110A Anterior displaced Type II dens fracture, initial encounter for closed fracture: Secondary | ICD-10-CM | POA: Diagnosis not present

## 2017-07-20 DIAGNOSIS — R51 Headache: Secondary | ICD-10-CM | POA: Diagnosis not present

## 2017-07-20 DIAGNOSIS — S0990XA Unspecified injury of head, initial encounter: Secondary | ICD-10-CM | POA: Diagnosis not present

## 2017-07-20 DIAGNOSIS — S12100A Unspecified displaced fracture of second cervical vertebra, initial encounter for closed fracture: Secondary | ICD-10-CM | POA: Insufficient documentation

## 2017-07-20 DIAGNOSIS — M542 Cervicalgia: Secondary | ICD-10-CM | POA: Diagnosis not present

## 2017-07-20 MED ORDER — MORPHINE SULFATE (PF) 4 MG/ML IV SOLN
INTRAVENOUS | Status: AC
  Filled 2017-07-20: qty 1

## 2017-07-20 MED ORDER — HYDROCODONE-ACETAMINOPHEN 5-325 MG PO TABS
ORAL_TABLET | ORAL | Status: AC
  Filled 2017-07-20: qty 1

## 2017-07-20 MED ORDER — HYDROCODONE-ACETAMINOPHEN 5-325 MG PO TABS: 1.0000 | ORAL_TABLET | ORAL | 0 refills | Status: AC | PRN

## 2017-07-20 NOTE — Discharge Instructions (Addendum)
As we discussed please leave the c-collar in place.  Please follow-up with neurosurgery Dr. Cari Caraway by calling the number provided to arrange for follow-up appointment as soon as possible.  Please take your pain medication as needed, but only as prescribed.  Return to the emergency department for any increased pain, any weakness or numbness of any arm or leg, or any other symptom personally concerning to yourself.

## 2017-07-20 NOTE — ED Triage Notes (Signed)
Pt to ED from home after a mechanical fall at home. Pt reports right knee pain and head a neck pain. Pt presents with a  C-collar placed and a small laceration to the left side of forehead. Bleeding under control. Unknown baseline but no obvious neuro deficits. Hx of dementia. No shortening or rotation of legs. No pain in hips or pelvis upon exam.   Pt has hx of Afib.

## 2017-07-20 NOTE — ED Provider Notes (Signed)
[facility] Emergency Department Provider Note  Time seen: 9:14 PM  I have reviewed the triage vital signs and the nursing notes.   HISTORY  Chief Complaint Fall    HPI Glenn Garcia is a 82 y.o. male with a past medical history of atrial fibrillation, gastric reflux, hypertension, hyperlipidemia, dementia, presents to the emergency department after a fall.  According to the wife the patient was getting up from his chair in the living room, stumbled forward and fell hitting his head on the sliding door.  Suffered an abrasion to his forehead, was complaining of pain in his neck and his knees.  Patient has a history of dementia, does not recall the fall himself.  Currently denies any pain but then at times will call out because of pain in his neck and knees.   Past Medical History:  Diagnosis Date  . Arthritis   . Atrial fibrillation ([place])   . Complication of anesthesia    slow waking after colonscopy  . Cough    chronic  . Dementia   . Dysrhythmia    a fib  . GERD (gastroesophageal reflux disease)   . Gout   . Heart murmur   . History of hiatal hernia   . Hyperlipidemia   . Hypertension   . Memory difficulties   . Migraines   . Shingles     Patient Active Problem List   Diagnosis Date Noted  . Nocturnal leg cramps 12/15/2016  . Depression 11/17/2016  . Cataract cortical, senile 10/13/2016  . Gout 10/13/2016  . B12 deficiency 10/13/2016  . Vitamin D deficiency 10/13/2016  . Alzheimer's dementia 09/30/2015  . Degenerative disc disease, cervical 02/12/2015  . Severe mitral insufficiency 01/01/2015  . Severe tricuspid valve insufficiency 07/01/2014  . Benign prostatic hyperplasia 04/08/2014  . Gastroesophageal reflux disease 04/08/2014  . Glaucoma 04/08/2014  . Hiatal hernia 04/08/2014  . Osteoarthritis 04/08/2014  . Peripheral neuropathy 04/08/2014  . Chronic systolic CHF (congestive heart failure), NYHA class 2 ([place]) 11/01/2013  .  Essential hypertension 11/01/2013  . Hyperlipidemia, mixed 11/01/2013  . Cardiomyopathy ([place]) 10/04/2013  . Chronic atrial fibrillation ([place]) 10/04/2013  . Malignant neoplasm of skin 10/04/2013  . Low back pain 12/27/2011  . Peyronie's disease 12/27/2011  . Testicular hypofunction 12/27/2011    Past Surgical History:  Procedure Laterality Date  . ANKLE SURGERY    . ANKLE SURGERY    . AXILLARY SURGERY    . CATARACT EXTRACTION W/PHACO Right 01/16/2015   Procedure: CATARACT EXTRACTION PHACO AND INTRAOCULAR LENS PLACEMENT (IOC);  Surgeon: Leandrew Koyanagi, MD;  Location: [facility] ORS;  Service: Ophthalmology;  Laterality: Right;  [place]   1:36  AP     14.2 CDE  13.55 casette lot    #[id number] H  . COLONOSCOPY    . KNEE SURGERY    . KNEE SURGERY      Prior to Admission medications   Medication Sig Start Date End Date Taking? Authorizing Provider  acetaminophen (TYLENOL) 500 MG tablet Take 1,000 mg by mouth every 8 (eight) hours as needed.    [provider]  allopurinol (ZYLOPRIM) 100 MG tablet Take 100 mg by mouth daily.    [provider]  amLODipine (NORVASC) 10 MG tablet Take 10 mg by mouth daily.    [provider]  brimonidine (ALPHAGAN) 0.15 % ophthalmic solution Place 1 drop into the right eye 2 (two) times daily.    [provider]  Cholecalciferol (VITAMIN D3) 5000 units CAPS  Take 1 capsule (5,000 Units total) by mouth daily. 10/14/16   Plonk, Gwyndolyn Saxon, MD  donepezil (ARICEPT) 10 MG tablet Take 1 tablet (10 mg total) by mouth at bedtime. 10/14/16   Plonk, Gwyndolyn Saxon, MD  latanoprost (XALATAN) 0.005 % ophthalmic solution Place 1 drop into both eyes at bedtime.     [provider]  mirtazapine (REMERON) 15 MG tablet Take 1 tablet (15 mg total) at bedtime by mouth. 11/17/16   Plonk, Gwyndolyn Saxon, MD  omeprazole (PRILOSEC) 40 MG capsule Take 40 mg by mouth every evening.     [provider]  rivaroxaban (XARELTO) 20 MG TABS tablet Take 20 mg by  mouth daily with supper.    [provider]  vitamin B-12 (CYANOCOBALAMIN) 1000 MCG tablet Take 1,000 mcg by mouth daily.    [provider]    Allergies  Allergen Reactions  . Ciprofloxacin Hcl   . Dorzolamide Other (See Comments)    Reaction:  Decreased heart rate   . Lipitor [Atorvastatin] Other (See Comments)    Reaction:  Muscle pain   . Timolol Other (See Comments)    Reaction:  Decreased heart rate     Family History  Problem Relation Age of Onset  . Cancer Father   . Cancer Sister   . Diabetes Sister   . Stroke Sister   . Early death Sister   . Diabetes Brother   . Alcohol abuse Brother   . Early death Brother     Social History Social History   Tobacco Use  . Smoking status: Never Smoker  . Smokeless tobacco: Never Used  Substance Use Topics  . Alcohol use: No  . Drug use: No    Review of Systems Unable to obtain an adequate/accurate review of systems secondary to baseline dementia. ____________________________________________   PHYSICAL EXAM:  VITAL SIGNS: ED Triage Vitals  Enc Vitals Group     BP 07/20/17 1911 (!) 136/93     Pulse Rate 07/20/17 1911 (!) 50     Resp 07/20/17 1911 16     Temp 07/20/17 1911 98.2 F (36.8 C)     Temp Source 07/20/17 1911 Oral     SpO2 07/20/17 1911 98 %     Weight 07/20/17 1912 135 lb (61.2 kg)     Height --      Head Circumference --      Peak Flow --      Pain Score --      Pain Loc --      Pain Edu? --      Excl. in [place]? --    Constitutional: Patient is alert, sitting in bed, appears comfortable no noted distress. Eyes: Normal exam ENT   Head: Patient has a 3 to 4 cm linear abrasion to his forehead, hemostatic does not require repair.   Mouth/Throat: Mucous membranes are moist. Cardiovascular: Normal rate, regular rhythm. Respiratory: Normal respiratory effort without tachypnea nor retractions. Breath sounds are clear  Gastrointestinal: Soft and nontender. No distention.    Musculoskeletal: Slight midline upper C-spine tenderness, c-collar in place.  Good range of motion in all extremities including bilateral lower extremities, no pain elicited at the hips knees elbows wrists or ankles. Neurologic:  Normal speech and language.  Able to move all extremities well, no obvious deficits. Skin:  Skin is warm.  Abrasion to forehead. Psychiatric: Mood and affect are normal.   ____________________________________________    RADIOLOGY  CT C-spine shows a horizontal lower dens fracture without displacement.  No evidence of intracranial abnormality. Knee x-ray shows tricompartmental osteoarthritis without acute fracture.  ____________________________________________   INITIAL IMPRESSION / ASSESSMENT AND PLAN / ED COURSE  Pertinent labs & imaging results that were available during my care of the patient were reviewed by me and considered in my medical decision making (see chart for details).  Patient presented to the emergency department after a mechanical fall at home.  Patient has a history of dementia and cannot contribute to his history or review of systems.  Wife is here who witnessed the fall.  Patient's main complaint appears to be intermittent neck pain and knee pain.  X-rays are negative for the knees, CT C-spine does show a dens fracture.  I discussed the patient with Dr. Cari Caraway of neurosurgery, he recommends placement in a [place] J or Aspen collar.  I was able to arrange for an Aspen collar replaced through a prosthetic company [employer].  They placed the collar in the emergency department.  Patient is ambulating here without significant difficulty.  ____________________________________________   FINAL CLINICAL IMPRESSION(S) / ED DIAGNOSES  Dens fracture/Cervical spine fracture Cheral Marker, MD 07/20/17 2126

## 2017-07-28 DIAGNOSIS — S12112D Nondisplaced Type II dens fracture, subsequent encounter for fracture with routine healing: Secondary | ICD-10-CM | POA: Diagnosis not present

## 2017-07-29 DIAGNOSIS — S129XXD Fracture of neck, unspecified, subsequent encounter: Secondary | ICD-10-CM | POA: Diagnosis not present

## 2017-07-29 DIAGNOSIS — R69 Illness, unspecified: Secondary | ICD-10-CM | POA: Diagnosis not present

## 2017-08-03 DIAGNOSIS — S12112A Nondisplaced Type II dens fracture, initial encounter for closed fracture: Secondary | ICD-10-CM | POA: Diagnosis not present

## 2017-08-08 ENCOUNTER — Other Ambulatory Visit: Payer: Self-pay | Admitting: Student

## 2017-08-08 DIAGNOSIS — S12112D Nondisplaced Type II dens fracture, subsequent encounter for fracture with routine healing: Secondary | ICD-10-CM

## 2017-08-18 DIAGNOSIS — R2689 Other abnormalities of gait and mobility: Secondary | ICD-10-CM | POA: Diagnosis not present

## 2017-08-31 ENCOUNTER — Ambulatory Visit
Admission: RE | Admit: 2017-08-31 | Discharge: 2017-08-31 | Disposition: A | Discharge status: Home / Self Care | Payer: Medicare HMO | Source: Ambulatory Visit | Attending: Student | Admitting: Student

## 2017-08-31 DIAGNOSIS — L989 Disorder of the skin and subcutaneous tissue, unspecified: Secondary | ICD-10-CM | POA: Insufficient documentation

## 2017-08-31 DIAGNOSIS — S12112D Nondisplaced Type II dens fracture, subsequent encounter for fracture with routine healing: Secondary | ICD-10-CM | POA: Diagnosis not present

## 2017-08-31 DIAGNOSIS — I428 Other cardiomyopathies: Secondary | ICD-10-CM | POA: Diagnosis not present

## 2017-08-31 DIAGNOSIS — R3914 Feeling of incomplete bladder emptying: Secondary | ICD-10-CM | POA: Diagnosis not present

## 2017-08-31 DIAGNOSIS — I10 Essential (primary) hypertension: Secondary | ICD-10-CM | POA: Diagnosis not present

## 2017-08-31 DIAGNOSIS — R350 Frequency of micturition: Secondary | ICD-10-CM | POA: Diagnosis not present

## 2017-08-31 DIAGNOSIS — X58XXXD Exposure to other specified factors, subsequent encounter: Secondary | ICD-10-CM | POA: Diagnosis not present

## 2017-08-31 DIAGNOSIS — R351 Nocturia: Secondary | ICD-10-CM | POA: Diagnosis not present

## 2017-08-31 DIAGNOSIS — I5022 Chronic systolic (congestive) heart failure: Secondary | ICD-10-CM | POA: Diagnosis not present

## 2017-08-31 DIAGNOSIS — N401 Enlarged prostate with lower urinary tract symptoms: Secondary | ICD-10-CM | POA: Diagnosis not present

## 2017-08-31 DIAGNOSIS — I340 Nonrheumatic mitral (valve) insufficiency: Secondary | ICD-10-CM | POA: Diagnosis not present

## 2017-08-31 DIAGNOSIS — I482 Chronic atrial fibrillation: Secondary | ICD-10-CM | POA: Diagnosis not present

## 2017-08-31 DIAGNOSIS — S12112A Nondisplaced Type II dens fracture, initial encounter for closed fracture: Secondary | ICD-10-CM | POA: Diagnosis not present

## 2017-09-05 DIAGNOSIS — I482 Chronic atrial fibrillation: Secondary | ICD-10-CM | POA: Diagnosis not present

## 2017-09-06 DIAGNOSIS — E782 Mixed hyperlipidemia: Secondary | ICD-10-CM | POA: Diagnosis not present

## 2017-09-06 DIAGNOSIS — I428 Other cardiomyopathies: Secondary | ICD-10-CM | POA: Diagnosis not present

## 2017-09-06 DIAGNOSIS — Z79899 Other long term (current) drug therapy: Secondary | ICD-10-CM | POA: Diagnosis not present

## 2017-09-06 DIAGNOSIS — R2689 Other abnormalities of gait and mobility: Secondary | ICD-10-CM | POA: Diagnosis not present

## 2017-09-06 DIAGNOSIS — I10 Essential (primary) hypertension: Secondary | ICD-10-CM | POA: Diagnosis not present

## 2017-09-06 DIAGNOSIS — I482 Chronic atrial fibrillation: Secondary | ICD-10-CM | POA: Diagnosis not present

## 2017-09-06 DIAGNOSIS — Z125 Encounter for screening for malignant neoplasm of prostate: Secondary | ICD-10-CM | POA: Diagnosis not present

## 2017-09-06 DIAGNOSIS — R69 Illness, unspecified: Secondary | ICD-10-CM | POA: Diagnosis not present

## 2017-09-06 DIAGNOSIS — S12100G Unspecified displaced fracture of second cervical vertebra, subsequent encounter for fracture with delayed healing: Secondary | ICD-10-CM | POA: Diagnosis not present

## 2017-09-09 DIAGNOSIS — I482 Chronic atrial fibrillation: Secondary | ICD-10-CM | POA: Diagnosis not present

## 2017-09-13 DIAGNOSIS — N401 Enlarged prostate with lower urinary tract symptoms: Secondary | ICD-10-CM | POA: Diagnosis not present

## 2017-09-14 DIAGNOSIS — I10 Essential (primary) hypertension: Secondary | ICD-10-CM | POA: Diagnosis not present

## 2017-09-14 DIAGNOSIS — I5022 Chronic systolic (congestive) heart failure: Secondary | ICD-10-CM | POA: Diagnosis not present

## 2017-09-14 DIAGNOSIS — I071 Rheumatic tricuspid insufficiency: Secondary | ICD-10-CM | POA: Diagnosis not present

## 2017-09-14 DIAGNOSIS — I482 Chronic atrial fibrillation: Secondary | ICD-10-CM | POA: Diagnosis not present

## 2017-09-21 DIAGNOSIS — R351 Nocturia: Secondary | ICD-10-CM | POA: Diagnosis not present

## 2017-09-21 DIAGNOSIS — R3914 Feeling of incomplete bladder emptying: Secondary | ICD-10-CM | POA: Diagnosis not present

## 2017-09-21 DIAGNOSIS — N401 Enlarged prostate with lower urinary tract symptoms: Secondary | ICD-10-CM | POA: Diagnosis not present

## 2017-09-21 DIAGNOSIS — R350 Frequency of micturition: Secondary | ICD-10-CM | POA: Diagnosis not present

## 2017-09-26 DIAGNOSIS — N401 Enlarged prostate with lower urinary tract symptoms: Secondary | ICD-10-CM | POA: Diagnosis not present

## 2017-09-26 DIAGNOSIS — N323 Diverticulum of bladder: Secondary | ICD-10-CM | POA: Diagnosis not present

## 2017-10-04 DIAGNOSIS — I10 Essential (primary) hypertension: Secondary | ICD-10-CM | POA: Diagnosis not present

## 2017-10-20 DIAGNOSIS — S12100G Unspecified displaced fracture of second cervical vertebra, subsequent encounter for fracture with delayed healing: Secondary | ICD-10-CM | POA: Diagnosis not present

## 2017-10-20 DIAGNOSIS — R69 Illness, unspecified: Secondary | ICD-10-CM | POA: Diagnosis not present

## 2017-10-20 DIAGNOSIS — I10 Essential (primary) hypertension: Secondary | ICD-10-CM | POA: Diagnosis not present

## 2017-10-20 DIAGNOSIS — M50323 Other cervical disc degeneration at C6-C7 level: Secondary | ICD-10-CM | POA: Diagnosis not present

## 2017-10-20 DIAGNOSIS — R351 Nocturia: Secondary | ICD-10-CM | POA: Diagnosis not present

## 2017-10-20 DIAGNOSIS — R3914 Feeling of incomplete bladder emptying: Secondary | ICD-10-CM | POA: Diagnosis not present

## 2017-10-20 DIAGNOSIS — R350 Frequency of micturition: Secondary | ICD-10-CM | POA: Diagnosis not present

## 2017-10-20 DIAGNOSIS — N401 Enlarged prostate with lower urinary tract symptoms: Secondary | ICD-10-CM | POA: Diagnosis not present

## 2017-10-25 DIAGNOSIS — N401 Enlarged prostate with lower urinary tract symptoms: Secondary | ICD-10-CM | POA: Diagnosis not present

## 2017-11-09 DIAGNOSIS — R972 Elevated prostate specific antigen [PSA]: Secondary | ICD-10-CM | POA: Diagnosis not present

## 2017-11-09 DIAGNOSIS — N401 Enlarged prostate with lower urinary tract symptoms: Secondary | ICD-10-CM | POA: Diagnosis not present

## 2017-12-01 DIAGNOSIS — E782 Mixed hyperlipidemia: Secondary | ICD-10-CM | POA: Diagnosis not present

## 2017-12-01 DIAGNOSIS — R69 Illness, unspecified: Secondary | ICD-10-CM | POA: Diagnosis not present

## 2017-12-01 DIAGNOSIS — I10 Essential (primary) hypertension: Secondary | ICD-10-CM | POA: Diagnosis not present

## 2017-12-01 DIAGNOSIS — I482 Chronic atrial fibrillation, unspecified: Secondary | ICD-10-CM | POA: Diagnosis not present

## 2018-01-07 IMAGING — CR DG CHEST 2V
1 series · 2 of 2 positions shown · non-contrast
Comparison: 05/09/2011

CLINICAL DATA: Chest pain and weakness

EXAM:
CHEST  2 VIEW

[Series 1: dg chest 2 view · 0.14mm/px · 2 of 2 slices shown]
[im 1/2]
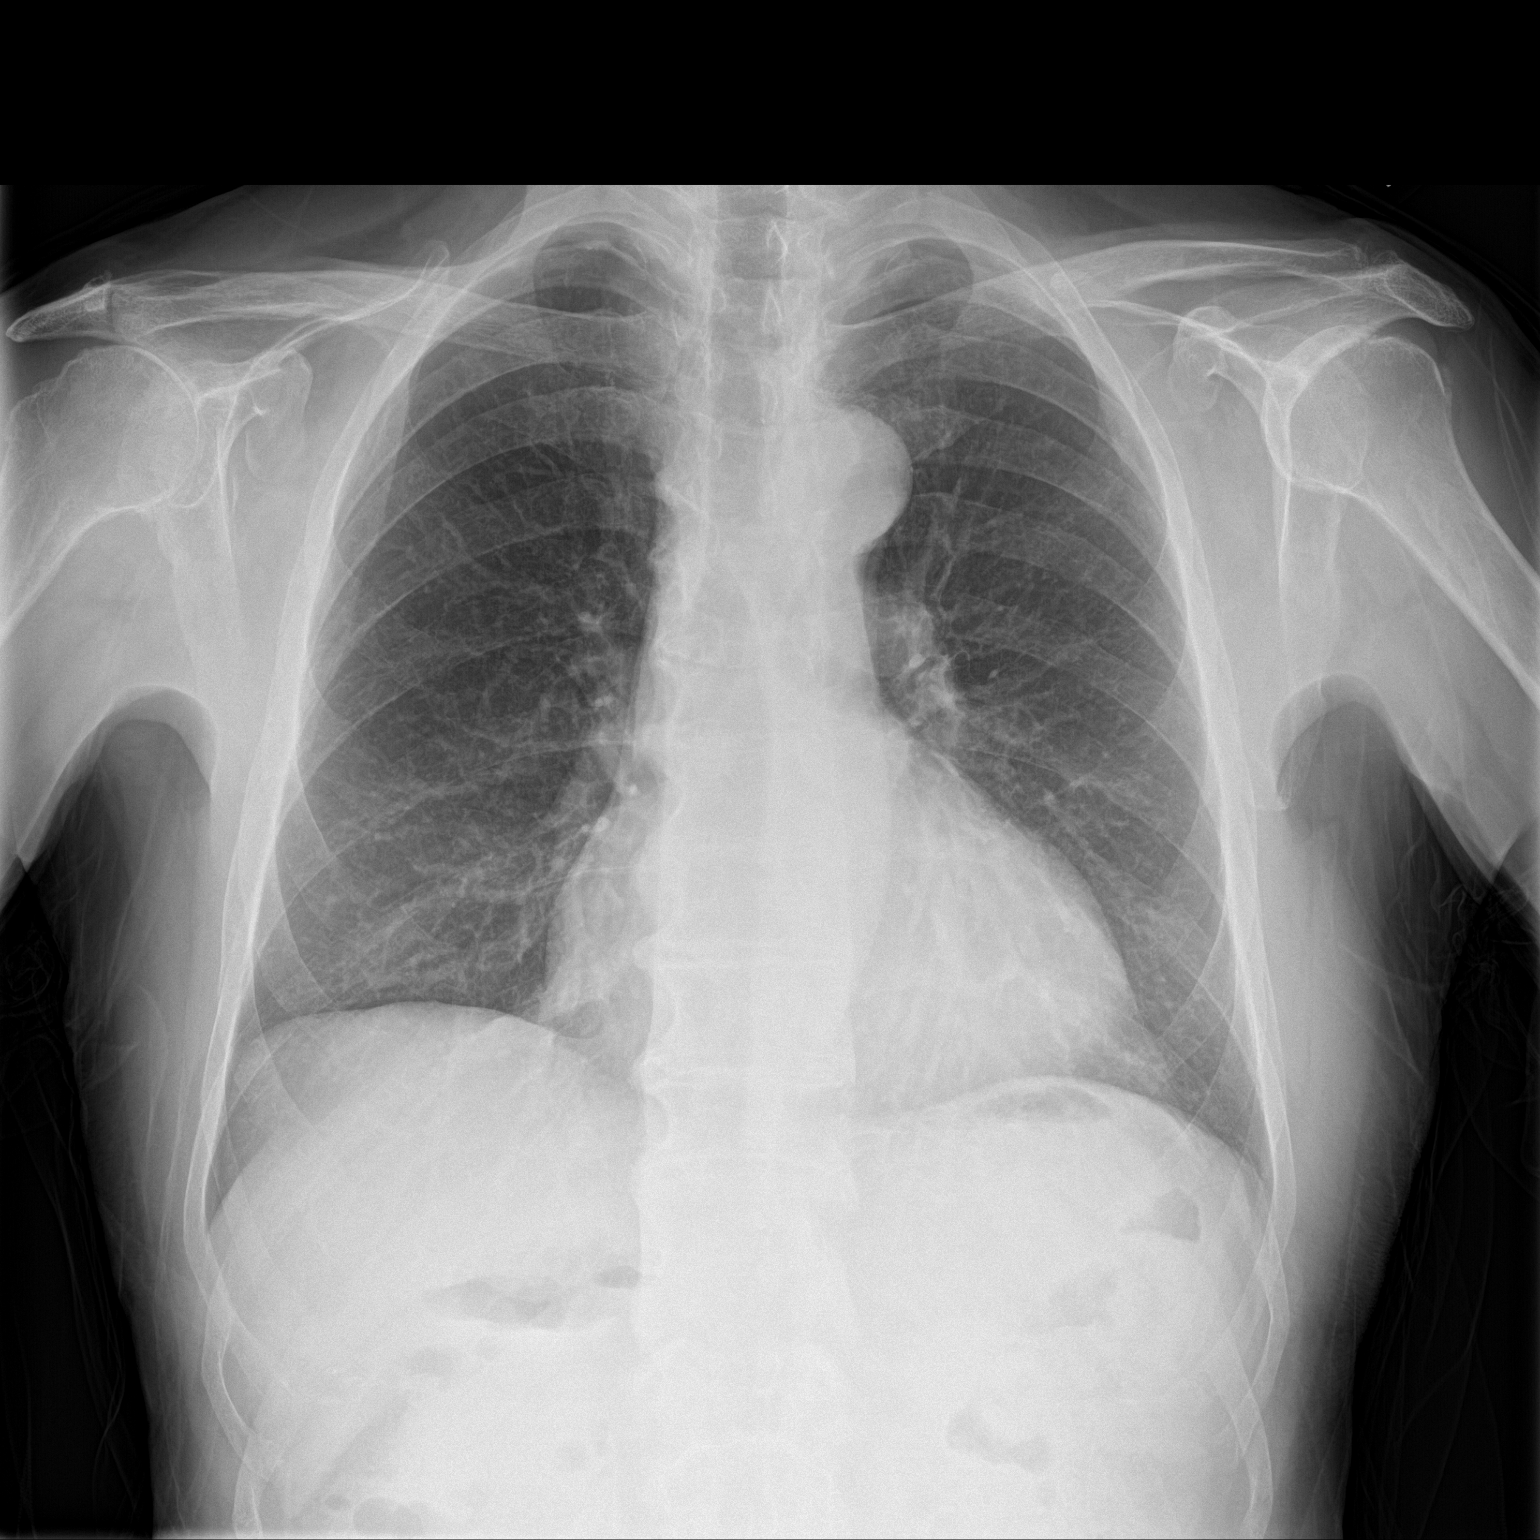
[im 2/2]
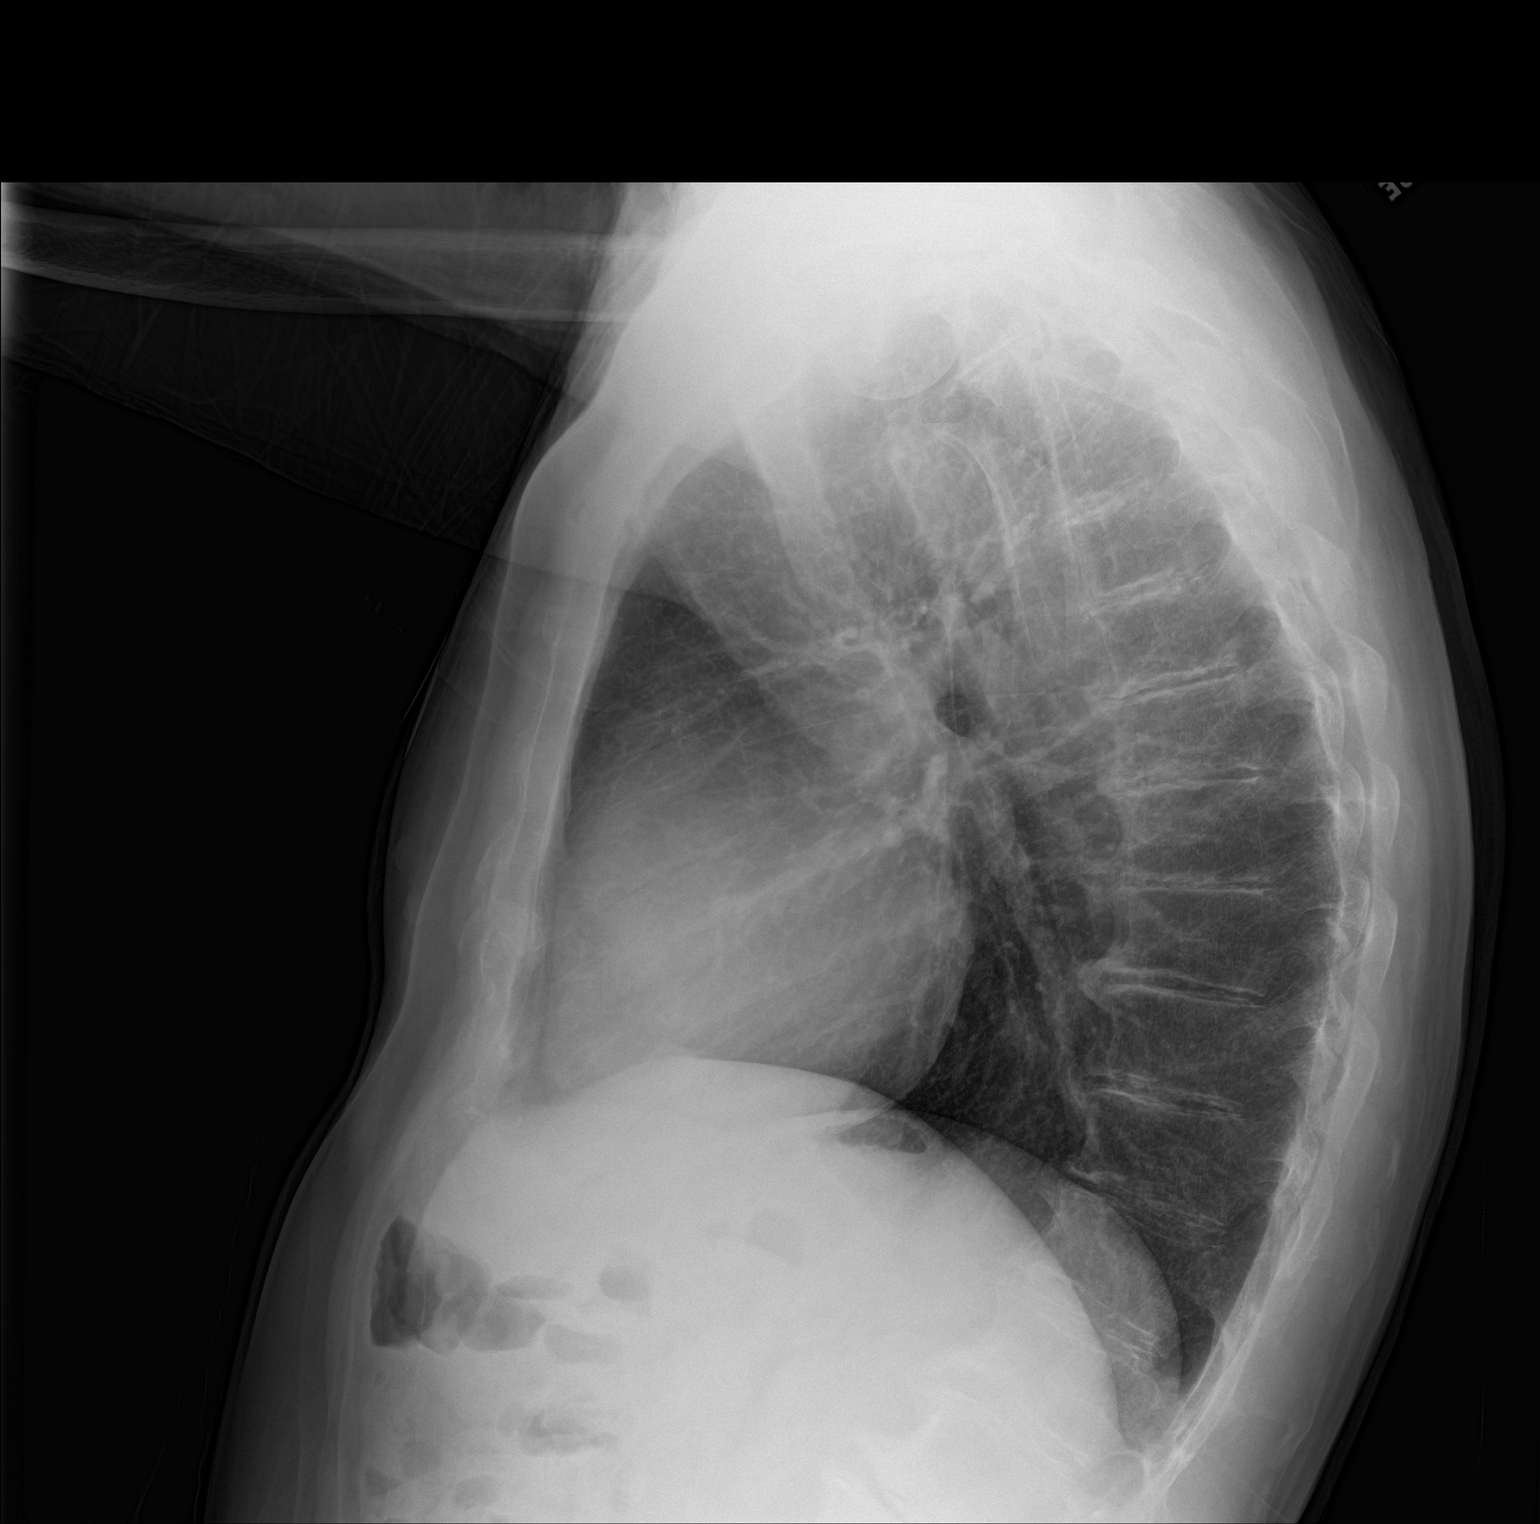

[2 of 2 positions shown; findings below may reference images not displayed]

FINDINGS: Cardiac shadow is stable in appearance. The lungs are well aerated
bilaterally. No focal infiltrate or sizable effusion is seen. No
acute bony abnormality is noted.
IMPRESSION: No active cardiopulmonary disease.

## 2018-01-27 ENCOUNTER — Emergency Department: Payer: Medicare HMO

## 2018-01-27 ENCOUNTER — Other Ambulatory Visit: Payer: Self-pay

## 2018-01-27 DIAGNOSIS — Y939 Activity, unspecified: Secondary | ICD-10-CM | POA: Insufficient documentation

## 2018-01-27 DIAGNOSIS — I5022 Chronic systolic (congestive) heart failure: Secondary | ICD-10-CM | POA: Diagnosis not present

## 2018-01-27 DIAGNOSIS — Z7901 Long term (current) use of anticoagulants: Secondary | ICD-10-CM | POA: Diagnosis not present

## 2018-01-27 DIAGNOSIS — Z79899 Other long term (current) drug therapy: Secondary | ICD-10-CM | POA: Diagnosis not present

## 2018-01-27 DIAGNOSIS — I110 Hypertensive heart disease with heart failure: Secondary | ICD-10-CM | POA: Diagnosis not present

## 2018-01-27 DIAGNOSIS — I10 Essential (primary) hypertension: Secondary | ICD-10-CM | POA: Diagnosis not present

## 2018-01-27 DIAGNOSIS — Y999 Unspecified external cause status: Secondary | ICD-10-CM | POA: Diagnosis not present

## 2018-01-27 DIAGNOSIS — F0280 Dementia in other diseases classified elsewhere without behavioral disturbance: Secondary | ICD-10-CM | POA: Insufficient documentation

## 2018-01-27 DIAGNOSIS — Y92009 Unspecified place in unspecified non-institutional (private) residence as the place of occurrence of the external cause: Secondary | ICD-10-CM | POA: Insufficient documentation

## 2018-01-27 DIAGNOSIS — G309 Alzheimer's disease, unspecified: Secondary | ICD-10-CM | POA: Insufficient documentation

## 2018-01-27 DIAGNOSIS — S0990XA Unspecified injury of head, initial encounter: Secondary | ICD-10-CM | POA: Diagnosis present

## 2018-01-27 DIAGNOSIS — S0101XA Laceration without foreign body of scalp, initial encounter: Secondary | ICD-10-CM | POA: Diagnosis not present

## 2018-01-27 DIAGNOSIS — W19XXXA Unspecified fall, initial encounter: Secondary | ICD-10-CM | POA: Diagnosis not present

## 2018-01-27 NOTE — ED Notes (Signed)
Patient to ED for a fall at home. Patient has dementia and presents with his son. Had neck fracture back in July and is scheduled for reevaluation sometime this month. Patient has small laceration to left side of head, bleeding controlled at this time.

## 2018-01-27 NOTE — ED Provider Notes (Signed)
[facility] Emergency Department Provider Note  ____________________________________________   First MD Initiated Contact with Patient 01/27/18 2359     (approximate)  I have reviewed the triage Garcia signs and the nursing notes.   HISTORY  Chief Complaint Fall  Level 5 exemption history is limited by the patient's profound dementia  HPI Glenn Garcia is a 83 y.o. male is brought to the emergency department by his son after a fall at home.  The patient has a past medical history of severe dementia and has had multiple falls recently.  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  This afternoon he had another fall injuring his left forehead.  He is behaving normally according to his son.   His tetanus is up-to-date.  No vomiting.   Past Medical History:  Diagnosis Date  . Arthritis   . Atrial fibrillation ([place])   . Complication of anesthesia    slow waking after colonscopy  . Cough    chronic  . Dementia   . Dysrhythmia    a fib  . GERD (gastroesophageal reflux disease)   . Gout   . Heart murmur   . History of hiatal hernia   . Hyperlipidemia   . Hypertension   . Memory difficulties   . Migraines   . Shingles     Patient Active Problem List   Diagnosis Date Noted  . Nocturnal leg cramps 12/15/2016  . Depression 11/17/2016  . Cataract cortical, senile 10/13/2016  . Gout 10/13/2016  . B12 deficiency 10/13/2016  . Vitamin D deficiency 10/13/2016  . Alzheimer's dementia ([place]) 09/30/2015  . Degenerative disc disease, cervical 02/12/2015  . Severe mitral insufficiency 01/01/2015  . Severe tricuspid valve insufficiency 07/01/2014  . Benign prostatic hyperplasia 04/08/2014  . Gastroesophageal reflux disease 04/08/2014  . Glaucoma 04/08/2014  . Hiatal hernia 04/08/2014  . Osteoarthritis 04/08/2014  . Peripheral neuropathy 04/08/2014  . Chronic  systolic CHF (congestive heart failure), NYHA class 2 ([place]) 11/01/2013  . Essential hypertension 11/01/2013  . Hyperlipidemia, mixed 11/01/2013  . Cardiomyopathy ([place]) 10/04/2013  . Chronic atrial fibrillation 10/04/2013  . Malignant neoplasm of skin 10/04/2013  . Low back pain 12/27/2011  . Peyronie's disease 12/27/2011  . Testicular hypofunction 12/27/2011    Past Surgical History:  Procedure Laterality Date  . ANKLE SURGERY    . ANKLE SURGERY    . AXILLARY SURGERY    . CATARACT EXTRACTION W/PHACO Right 01/16/2015   Procedure: CATARACT EXTRACTION PHACO AND INTRAOCULAR LENS PLACEMENT (IOC);  Surgeon: Leandrew Koyanagi, MD;  Location: [facility] ORS;  Service: Ophthalmology;  Laterality: Right;  [place]   1:36  AP     14.2 CDE  13.55 casette lot    #[id number] H  . COLONOSCOPY    . KNEE SURGERY    . KNEE SURGERY      Prior to Admission medications   Medication Sig Start Date End Date Taking? Authorizing Provider  acetaminophen (TYLENOL) 500 MG tablet Take 1,000 mg by mouth every 8 (eight) hours as needed.    [provider]  allopurinol (ZYLOPRIM) 100 MG tablet Take 100 mg by mouth daily.    [provider]  amLODipine (NORVASC) 10 MG tablet Take 10 mg by mouth daily.    [provider]  brimonidine (ALPHAGAN) 0.15 % ophthalmic solution Place 1 drop into the right eye 2 (two) times daily.    [provider]  Cholecalciferol (VITAMIN D3) 5000 units CAPS Take 1 capsule (5,000 Units total) by mouth daily. 10/14/16   Plonk, Gwyndolyn Saxon, MD  donepezil (ARICEPT) 10 MG tablet Take 1 tablet (10 mg total) by mouth at bedtime. 10/14/16   Plonk, Gwyndolyn Saxon, MD  HYDROcodone-acetaminophen (NORCO/VICODIN) 5-325 MG tablet Take 1 tablet by mouth every 4 (four) hours as needed for moderate pain. 07/20/17 07/20/18  Harvest Dark, MD  latanoprost (XALATAN) 0.005 % ophthalmic solution Place 1 drop into both eyes at bedtime.     [provider]  mirtazapine (REMERON) 15 MG  tablet Take 1 tablet (15 mg total) at bedtime by mouth. 11/17/16   Plonk, Gwyndolyn Saxon, MD  omeprazole (PRILOSEC) 40 MG capsule Take 40 mg by mouth every evening.     [provider]  rivaroxaban (XARELTO) 20 MG TABS tablet Take 20 mg by mouth daily with supper.    [provider]  vitamin B-12 (CYANOCOBALAMIN) 1000 MCG tablet Take 1,000 mcg by mouth daily.    [provider]    Allergies Ciprofloxacin hcl; Dorzolamide; Lipitor [atorvastatin]; and Timolol  Family History  Problem Relation Age of Onset  . Cancer Father   . Cancer Sister   . Diabetes Sister   . Stroke Sister   . Early death Sister   . Diabetes Brother   . Alcohol abuse Brother   . Early death Brother     Social History Social History   Tobacco Use  . Smoking status: Never Smoker  . Smokeless tobacco: Never Used  Substance Use Topics  . Alcohol use: No  . Drug use: No    Review of Systems Of 5 exemption history is limited by the patient's dementia  ____________________________________________   PHYSICAL EXAM:  Garcia SIGNS: ED Triage Vitals  Enc Vitals Group     BP 01/27/18 2106 108/74     Pulse Rate 01/27/18 2106 68     Resp 01/27/18 2106 18     Temp --      Temp src --      SpO2 01/27/18 2106 98 %     Weight 01/27/18 2103 112 lb (50.8 kg)     Height 01/27/18 2103 5\' 4"  (1.626 m)     Head Circumference --      Peak Flow --      Pain Score --      Pain Loc --      Pain Edu? --      Excl. in [place]? --     Constitutional: Pleasant and cooperative.  Severe dementia Head: 3 cm laceration to left parietal with no active bleeding. Nose: No congestion/rhinnorhea. Mouth/Throat: No trismus Neck: No stridor.  Cervical collar in place no midline tenderness Cardiovascular: Regular rate and rhythm Respiratory: Normal respiratory effort.  No retractions. Gastrointestinal: Soft nontender Neurologic:   No gross focal neurologic deficits are appreciated.  Skin: Wound is  above    ____________________________________________  LABS (all labs ordered are listed, but only abnormal results are displayed)  Labs Reviewed - No data to display   __________________________________________  EKG   ____________________________________________  RADIOLOGY  CT scan of the head neck reviewed by me with no acute disease.  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   As part of my medical decision making, I reviewed the following data within the [place] History obtained from family if available, nursing notes, old chart and ekg, as well as notes from prior ED visits.  The patient comes to the emergency department after mechanical fall sustaining a laceration to his left parietal.  CT scan of his head and neck are fortunately negative for acute disease but does show continued nonunion.  I discussed with the patient's son who is his caretaker that I was happy to clean out the wound anesthetize it and closed it with staples however the shot and the cleaning would be somewhat uncomfortable for the patient.  The other option would be to leave it as it will heal up nicely on its own although with a more significant scar.  His tetanus is up-to-date.  The son is opted for closure via secondary intention as he does not want his father to undergo injection and the pain involved in that which I think is entirely reasonable.  He is discharged home  with primary care and neurosurgical follow-up as scheduled.      ____________________________________________   FINAL CLINICAL IMPRESSION(S) / ED DIAGNOSES  Final diagnoses:  Fall, initial encounter  Laceration of scalp, initial encounter      NEW MEDICATIONS STARTED DURING THIS VISIT:  Discharge Medication List as of 01/28/2018 12:18 AM       Note:  This document was prepared using Dragon voice recognition software and may include unintentional dictation errors.     Darel Hong, MD 01/31/18 [phone]

## 2018-01-27 NOTE — ED Triage Notes (Signed)
Per son had a fall today and has a laceration to left side of his head.  Per son patient had broken neck back in July and constantly wears a hard collar.  Had final follow up later this month.

## 2018-01-28 ENCOUNTER — Emergency Department
Admission: EM | Admit: 2018-01-28 | Discharge: 2018-01-28 | Disposition: A | Discharge status: Home / Self Care | Payer: Medicare HMO | Attending: Emergency Medicine | Admitting: Emergency Medicine

## 2018-01-28 DIAGNOSIS — W19XXXA Unspecified fall, initial encounter: Secondary | ICD-10-CM

## 2018-01-28 DIAGNOSIS — S0101XA Laceration without foreign body of scalp, initial encounter: Secondary | ICD-10-CM

## 2018-01-28 MED ORDER — LIDOCAINE HCL (PF) 1 % IJ SOLN: 5.0000 mL | Freq: Once | INTRAMUSCULAR | Status: DC

## 2018-01-28 NOTE — ED Notes (Signed)
No peripheral IV placed this visit.   Discharge instructions reviewed with patient. Questions fielded by this RN. Patient verbalizes understanding of instructions. Patient discharged home in stable condition per rifenbark. No acute distress noted at time of discharge.    Pt wheeled to front and placed in car by this RN and pt's son, DC POC discussed with same

## 2018-01-28 NOTE — ED Notes (Signed)
Pt with son, who reports (pt hx of dementia), "fell backwards against french door and slide to floor", recent fall hx, recent changes to BP meds, pt takes Xeralto nightly (but not tonight after fall) for AFIB, pt abrasion to head cleaned and bandaged  Pt denies pain

## 2018-01-28 NOTE — Discharge Instructions (Signed)
Please keep your wound clean and dry and wash it in the shower every day.  It should take about 10-14 days to heal up fully.  Return to the ED for any concerns.  It was a pleasure to take care of you today, and thank you for coming to our emergency department.  If you have any questions or concerns before leaving please ask the nurse to grab me and I'm more than happy to go through your aftercare instructions again.  If you have any concerns once you are home that you are not improving or are in fact getting worse before you can make it to your follow-up appointment, please do not hesitate to call 911 and come back for further evaluation.  Darel Hong, MD   Ct Head Wo Contrast  Result Date: 01/27/2018 CLINICAL DATA:  83 year old male status post fall today. History of type 2 odontoid fracture in July 2019. EXAM: CT HEAD WITHOUT CONTRAST CT CERVICAL SPINE WITHOUT CONTRAST TECHNIQUE: Multidetector CT imaging of the head and cervical spine was performed following the standard protocol without intravenous contrast. Multiplanar CT image reconstructions of the cervical spine were also generated. COMPARISON:  Cervical spine CT 08/31/2017 and earlier. FINDINGS: CT HEAD FINDINGS Brain: Stable cerebral volume. No midline shift, mass effect, or evidence of intracranial mass lesion. Stable ventricle size and configuration. Stable patchy bilateral cerebral white matter hypodensity. No acute intracranial hemorrhage identified. No cortically based acute infarct identified. Vascular: Mild Calcified atherosclerosis at the skull base. No suspicious intracranial vascular hyperdensity. Skull: Stable, intact. Sinuses/Orbits: Visualized paranasal sinuses and mastoids are stable and well pneumatized. Other: Stable orbit and scalp soft tissues. CT CERVICAL SPINE FINDINGS Alignment: Stable straightening of cervical lordosis. Cervicothoracic junction alignment is within normal limits. Bilateral posterior element alignment is  within normal limits. Skull base and vertebrae: Unhealed type 2 odontoid fracture with corticated fracture margins now. Atlantal dens interval remains normal. But there has been slight posterior subluxation of the C1 dens on the body of C2 since August. Lateral C1-C2 alignment remains normal. Ligamentous hypertrophy about the odontoid appears stable. Skull base and C1 remain intact. Osteopenia. No new cervical spine fracture identified. Soft tissues and spinal canal: No prevertebral fluid or swelling. No visible canal hematoma. The suspected right vallecula retention cyst in July has resolved. Otherwise stable noncontrast neck soft tissues. Disc levels: Stable chronic cervical disc space loss and endplate spurring. Upper chest: Visible upper thoracic levels appears stable. Negative lung apices. IMPRESSION: 1. 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 Slight posterior subluxation has occurred since August, but otherwise satisfactory C1-C2 alignment. 2. No new osseous abnormality in the cervical spine. 3. Stable non contrast CT appearance of the brain. No acute intracranial abnormality. Electronically Signed   By: Genevie Ann M.D.   On: 01/27/2018 21:51   Ct Cervical Spine Wo Contrast  Result Date: 01/27/2018 CLINICAL DATA:  83 year old male status post fall today. History of type 2 odontoid fracture in July 2019. EXAM: CT HEAD WITHOUT CONTRAST CT CERVICAL SPINE WITHOUT CONTRAST TECHNIQUE: Multidetector CT imaging of the head and cervical spine was performed following the standard protocol without intravenous contrast. Multiplanar CT image reconstructions of the cervical spine were also generated. COMPARISON:  Cervical spine CT 08/31/2017 and earlier. FINDINGS: CT HEAD FINDINGS Brain: Stable cerebral volume. No midline shift, mass effect, or evidence of intracranial mass lesion. Stable ventricle size and configuration. Stable patchy bilateral cerebral white matter hypodensity. No acute intracranial  hemorrhage identified. No cortically based acute infarct  identified. Vascular: Mild Calcified atherosclerosis at the skull base. No suspicious intracranial vascular hyperdensity. Skull: Stable, intact. Sinuses/Orbits: Visualized paranasal sinuses and mastoids are stable and well pneumatized. Other: Stable orbit and scalp soft tissues. CT CERVICAL SPINE FINDINGS Alignment: Stable straightening of cervical lordosis. Cervicothoracic junction alignment is within normal limits. Bilateral posterior element alignment is within normal limits. Skull base and vertebrae: Unhealed type 2 odontoid fracture with corticated fracture margins now. Atlantal dens interval remains normal. But there has been slight posterior subluxation of the C1 dens on the body of C2 since August. Lateral C1-C2 alignment remains normal. Ligamentous hypertrophy about the odontoid appears stable. Skull base and C1 remain intact. Osteopenia. No new cervical spine fracture identified. Soft tissues and spinal canal: No prevertebral fluid or swelling. No visible canal hematoma. The suspected right vallecula retention cyst in July has resolved. Otherwise stable noncontrast neck soft tissues. Disc levels: Stable chronic cervical disc space loss and endplate spurring. Upper chest: Visible upper thoracic levels appears stable. Negative lung apices. IMPRESSION: 1. 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 Slight posterior subluxation has occurred since August, but otherwise satisfactory C1-C2 alignment. 2. No new osseous abnormality in the cervical spine. 3. Stable non contrast CT appearance of the brain. No acute intracranial abnormality. Electronically Signed   By: Genevie Ann M.D.   On: 01/27/2018 21:51

## 2018-02-13 ENCOUNTER — Encounter: Payer: Medicare HMO | Attending: Physician Assistant | Admitting: Physician Assistant

## 2018-02-13 DIAGNOSIS — K219 Gastro-esophageal reflux disease without esophagitis: Secondary | ICD-10-CM | POA: Diagnosis not present

## 2018-02-13 DIAGNOSIS — Z809 Family history of malignant neoplasm, unspecified: Secondary | ICD-10-CM | POA: Diagnosis not present

## 2018-02-13 DIAGNOSIS — L89893 Pressure ulcer of other site, stage 3: Secondary | ICD-10-CM | POA: Diagnosis not present

## 2018-02-13 DIAGNOSIS — I482 Chronic atrial fibrillation, unspecified: Secondary | ICD-10-CM | POA: Diagnosis not present

## 2018-02-13 DIAGNOSIS — F0150 Vascular dementia without behavioral disturbance: Secondary | ICD-10-CM | POA: Diagnosis not present

## 2018-02-13 DIAGNOSIS — H409 Unspecified glaucoma: Secondary | ICD-10-CM | POA: Diagnosis not present

## 2018-02-13 DIAGNOSIS — E11622 Type 2 diabetes mellitus with other skin ulcer: Secondary | ICD-10-CM | POA: Insufficient documentation

## 2018-02-13 DIAGNOSIS — Z836 Family history of other diseases of the respiratory system: Secondary | ICD-10-CM | POA: Insufficient documentation

## 2018-02-13 DIAGNOSIS — I10 Essential (primary) hypertension: Secondary | ICD-10-CM | POA: Insufficient documentation

## 2018-02-13 DIAGNOSIS — M1990 Unspecified osteoarthritis, unspecified site: Secondary | ICD-10-CM | POA: Insufficient documentation

## 2018-02-13 DIAGNOSIS — Z823 Family history of stroke: Secondary | ICD-10-CM | POA: Diagnosis not present

## 2018-02-14 NOTE — Progress Notes (Signed)
MADIX, BLOWE ([id number]) Visit Report for 02/13/2018 Abuse/Suicide Risk Screen Details Patient Name: Glenn Garcia, Glenn Garcia. Date of Service: 02/13/2018 10:15 AM Medical Record Number: [id number] Patient Account Number: [id number] Date of Birth/Sex: 13-Jan-1935 (83 y.o. M) Treating RN: Montey Hora Primary Care [employer] [employer]: Fulton Reek Other Clinician: Referring Jeslin Bazinet: Referral, Self Treating Yareth Kearse/Extender: Melburn Hake, HOYT Weeks in Treatment: 0 Abuse/Suicide Risk Screen Items Answer ABUSE/SUICIDE RISK SCREEN: Has anyone close to you tried to hurt or harm you recentlyo No Do you feel uncomfortable with anyone in your familyo No Has anyone forced you do things that you didnot want to doo No Do you have any thoughts of harming yourselfo No Patient displays signs or symptoms of abuse and/or neglect. No Electronic Signature(s) Signed: 02/13/2018 4:27:23 PM By: Montey Hora Entered By: Montey Hora on 02/13/2018 10:48:10 Glenn Garcia ([id number]) -------------------------------------------------------------------------------- Activities of Daily Living Details Patient Name: Glenn Garcia Date of Service: 02/13/2018 10:15 AM Medical Record Number: [id number] Patient Account Number: [id number] Date of Birth/Sex: [birth year] (83 y.o. M) Treating RN: Montey Hora Primary Care Nene Aranas: Fulton Reek Other Clinician: Referring Reann Dobias: Referral, Self Treating Dareion Kneece/Extender: Melburn Hake, HOYT Weeks in Treatment: 0 Activities of Daily Living Items Answer Activities of Daily Living (Please select one for each item) Drive Automobile Not Able Take Medications Need Assistance Use Telephone Need Assistance Care for Appearance Need Assistance Use Toilet Need Assistance Bath / Shower Need Assistance Dress Self Need Assistance Feed Self Need Assistance Walk Need Assistance Get In / Out Bed Need Assistance Housework Need Assistance Prepare Meals Need Assistance Handle  Money Not Able Shop for Self Need Assistance Electronic Signature(s) Signed: 02/13/2018 4:27:23 PM By: Montey Hora Entered By: Montey Hora on 02/13/2018 10:48:45 Glenn Garcia ([id number]) -------------------------------------------------------------------------------- Education Assessment Details Patient Name: Glenn Garcia Date of Service: 02/13/2018 10:15 AM Medical Record Number: [id number] Patient Account Number: [id number] Date of Birth/Sex: 1935/10/15 (83 y.o. M) Treating RN: Montey Hora Primary Care Breianna Delfino: Fulton Reek Other Clinician: Referring Aysha Livecchi: Referral, Self Treating Dimarco Minkin/Extender: Melburn Hake, HOYT Weeks in Treatment: 0 Primary Learner Assessed: Caregiver spouse Reason Patient is not Primary Learner: dementia Learning Preferences/Education Level/Primary Language Learning Preference: Explanation, Demonstration Preferred Language: English Cognitive Barrier Assessment/Beliefs Language Barrier: No Translator Needed: No Memory Deficit: No Emotional Barrier: No Cultural/Religious Beliefs Affecting Medical Care: No Physical Barrier Assessment Impaired Vision: No Impaired Hearing: No Decreased Hand dexterity: No Knowledge/Comprehension Assessment Knowledge Level: Medium Comprehension Level: Medium Ability to understand written Medium instructions: Ability to understand verbal Medium instructions: Motivation Assessment Anxiety Level: Calm Cooperation: Cooperative Education Importance: Acknowledges Need Interest in Health Problems: Asks Questions Perception: Coherent Willingness to Engage in Self- Medium Management Activities: Readiness to Engage in Self- Medium Management Activities: Electronic Signature(s) Signed: 02/13/2018 4:27:23 PM By: Montey Hora Entered By: Montey Hora on 02/13/2018 10:49:18 Glenn Garcia ([id number]) -------------------------------------------------------------------------------- Fall Risk  Assessment Details Patient Name: Glenn Garcia Date of Service: 02/13/2018 10:15 AM Medical Record Number: [id number] Patient Account Number: [id number] Date of Birth/Sex: [birth year] (83 y.o. M) Treating RN: Montey Hora Primary Care Lynniah Janoski: Fulton Reek Other Clinician: Referring Huck Ashworth: Referral, Self Treating Bensen Chadderdon/Extender: Melburn Hake, HOYT Weeks in Treatment: 0 Fall Risk Assessment Items Have you had 2 or more falls in the last 12 monthso 0 Yes Have you had any fall that resulted in injury in the last 12 monthso 0 Yes FALL RISK ASSESSMENT: History of falling - immediate or within 3 months 25 Yes Secondary diagnosis 0 No Ambulatory aid None/bed rest/wheelchair/nurse 0 Yes Crutches/cane/walker 0  No Furniture 0 No IV Access/Saline Lock 0 No Gait/Training Normal/bed rest/immobile 0 No Weak 10 Yes Impaired 20 Yes Mental Status Oriented to own ability 0 Yes Electronic Signature(s) Signed: 02/13/2018 4:27:23 PM By: Montey Hora Entered By: Montey Hora on 02/13/2018 10:47:19 Glenn Garcia, Glenn Garcia ([id number]) -------------------------------------------------------------------------------- Foot Assessment Details Patient Name: Glenn Garcia Date of Service: 02/13/2018 10:15 AM Medical Record Number: [id number] Patient Account Number: [id number] Date of Birth/Sex: 1935/10/07 (83 y.o. M) Treating RN: Montey Hora Primary Care Maximillion Gill: Fulton Reek Other Clinician: Referring Annelle Behrendt: Referral, Self Treating Zamar Odwyer/Extender: Melburn Hake, HOYT Weeks in Treatment: 0 Foot Assessment Items Site Locations + = Sensation present, - = Sensation absent, C = Callus, U = Ulcer R = Redness, W = Warmth, M = Maceration, PU = Pre-ulcerative lesion F = Fissure, S = Swelling, D = Dryness Assessment Right: Left: Other Deformity: No No Prior Foot Ulcer: No No Prior Amputation: No No Charcot Joint: No No Ambulatory Status: Ambulatory With Help Gait:  Unsteady Notes ambulates with assistance Electronic Signature(s) Signed: 02/13/2018 4:27:23 PM By: Montey Hora Entered By: Montey Hora on 02/13/2018 10:50:49 Glenn Garcia ([id number]) -------------------------------------------------------------------------------- Nutrition Risk Assessment Details Patient Name: Glenn Garcia Date of Service: 02/13/2018 10:15 AM Medical Record Number: [id number] Patient Account Number: [id number] Date of Birth/Sex: Mar 31, 1935 (83 y.o. M) Treating RN: Montey Hora Primary Care Summerlyn Fickel: Fulton Reek Other Clinician: Referring Cassiel Fernandez: Referral, Self Treating Sirinity Outland/Extender: Melburn Hake, HOYT Weeks in Treatment: 0 Height (in): 62 Weight (lbs): 112 Body Mass Index (BMI): 20.5 Nutrition Risk Assessment Items NUTRITION RISK SCREEN: I have an illness or condition that made me change the kind and/or amount of 0 No food I eat I eat fewer than two meals per day 0 No I eat few fruits and vegetables, or milk products 0 No I have three or more drinks of beer, liquor or wine almost every day 0 No I have tooth or mouth problems that make it hard for me to eat 2 Yes I don't always have enough money to buy the food I need 0 No I eat alone most of the time 0 No I take three or more different prescribed or over-the-counter drugs a day 1 Yes Without wanting to, I have lost or gained 10 pounds in the last six months 0 No I am not always physically able to shop, cook and/or feed myself 2 Yes Nutrition Protocols Good Risk Protocol Provide education on Moderate Risk Protocol 0 nutrition Electronic Signature(s) Signed: 02/13/2018 4:27:23 PM By: Montey Hora Entered By: Montey Hora on 02/13/2018 10:50:08

## 2018-02-17 NOTE — Progress Notes (Signed)
KAZMIR, OKI ([id number]) Visit Report for 02/13/2018 Chief Complaint Document Details Patient Name: Glenn Garcia, Glenn Garcia. Date of Service: 02/13/2018 10:15 AM Medical Record Number: [id number] Patient Account Number: [id number] Date of Birth/Sex: 10/31/1935 (83 y.o. M) Treating RN: Harold Barban Primary Care Provider: Fulton Reek Other Clinician: Referring Provider: Referral, Self Treating Provider/Extender: Melburn Hake, HOYT Weeks in Treatment: 0 Information Obtained from: Patient Chief Complaint Chest wall ulcer Electronic Signature(s) Signed: 02/14/2018 8:18:44 AM By: Worthy Keeler PA-C Entered By: Worthy Keeler on 02/13/2018 11:27:59 Glenn Garcia ([id number]) -------------------------------------------------------------------------------- Debridement Details Patient Name: Glenn Garcia Date of Service: 02/13/2018 10:15 AM Medical Record Number: [id number] Patient Account Number: [id number] Date of Birth/Sex: 07/21/1935 (83 y.o. M) Treating RN: Harold Barban Primary Care Provider: Fulton Reek Other Clinician: Referring Provider: Referral, Self Treating Provider/Extender: Melburn Hake, HOYT Weeks in Treatment: 0 Debridement Performed for Wound #1 Left,Proximal Chest Assessment: Performed By: Physician STONE III, HOYT E., PA-C Debridement Type: Chemical/Enzymatic/Mechanical Agent Used: gauze and saline Level of Consciousness (Pre- Awake and Alert procedure): Pre-procedure Verification/Time Yes - 11:38 Out Taken: Start Time: 11:38 Pain Control: Lidocaine Instrument: Other : gauze and saline Bleeding: None End Time: 11:40 Procedural Pain: 0 Post Procedural Pain: 0 Response to Treatment: Procedure was tolerated well Level of Consciousness Awake and Alert (Post-procedure): Post Debridement Measurements of Total Wound Length: (cm) 0.9 Stage: Category/Stage II Width: (cm) 1.1 Depth: (cm) 0.1 Volume: (cm) 0.078 Character of Wound/Ulcer  Post Improved Debridement: Post Procedure Diagnosis Same as Pre-procedure Electronic Signature(s) Signed: 02/14/2018 8:18:44 AM By: Worthy Keeler PA-C Signed: 02/16/2018 11:43:02 AM By: Harold Barban Entered By: Harold Barban on 02/13/2018 11:45:26 Meetze, Nilda Simmer ([id number]) -------------------------------------------------------------------------------- HPI Details Patient Name: Glenn Garcia Date of Service: 02/13/2018 10:15 AM Medical Record Number: [id number] Patient Account Number: [id number] Date of Birth/Sex: January 04, 1936 (83 y.o. M) Treating RN: Harold Barban Primary Care Provider: Fulton Reek Other Clinician: Referring Provider: Referral, Self Treating Provider/Extender: Melburn Hake, HOYT Weeks in Treatment: 0 History of Present Illness HPI Description: 02/13/18 patient presents today for initial evaluation our clinic concerning issues that he has been having with a pressure area over the collarbone on the left secondary to having to wear a cervical collar since July 2019 when he sustained a C2 cervical fracture. His son and wife have been caring for him and have done an excellent job up to this point. With that being said he does prefer to lay on his left side and I think this may be making him more prone to breakdown and pressure at the caller region on the left. Fortunately there is no signs of infection this time and there is no bone exposed although this is a very precarious location just as in fact there's not much skin overlying the area of concern. With that being said I do believe that there is no evidence of infection currently which is good news. The biggest concern is gonna be that he will continue to require offloading since he's gonna continue to we need the cervical collar in order to prevent further breakdown. Patient does have a history of fairly significant dimension along with hypertension and chronic issue fibrillation. Other than that obviously  just the cervical fracture at C2 which he has been in the cervical collar for since July 2019. This injury occurred as a result of the fall. Electronic Signature(s) Signed: 02/14/2018 8:18:44 AM By: Worthy Keeler PA-C Entered By: Worthy Keeler on 02/13/2018 11:56:30 Beaudin, Nilda Simmer ([id number]) -------------------------------------------------------------------------------- Physical Exam  Details Patient Name: Glenn Garcia. Date of Service: 02/13/2018 10:15 AM Medical Record Number: [id number] Patient Account Number: [id number] Date of Birth/Sex: 1935/04/21 (83 y.o. M) Treating RN: Harold Barban Primary Care Provider: Fulton Reek Other Clinician: Referring Provider: Referral, Self Treating Provider/Extender: Melburn Hake, HOYT Weeks in Treatment: 0 Constitutional sitting or standing blood pressure is within target range for patient.. pulse regular and within target range for patient.Marland Kitchen respirations regular, non-labored and within target range for patient.Marland Kitchen temperature within target range for patient.. Well- nourished and well-hydrated in no acute distress. Eyes conjunctiva clear no eyelid edema noted. pupils equal round and reactive to light and accommodation. Ears, Nose, Mouth, and Throat no gross abnormality of ear auricles or external auditory canals. normal hearing noted during conversation. mucus membranes moist. Respiratory normal breathing without difficulty. clear to auscultation bilaterally. Cardiovascular regular rate and rhythm with normal S1, S2. no clubbing, cyanosis, significant edema, <3 sec cap refill. Gastrointestinal (GI) soft, non-tender, non-distended, +BS. no ventral hernia noted. Musculoskeletal unsteady while walking. Psychiatric Patient is not able to cooperate in decision making regarding care. Patient has dementia. patient is confused. Notes Patient's wound bed currently again showed to be fairly good there was some minimal Slough noted on the  surface of the wound which I did mechanically debrided away with saline and gauze and the patient tolerated this today without complication. There did not appear to be any evidence of infection and no sharp debridement ended up being necessary during the evaluation today. Electronic Signature(s) Signed: 02/14/2018 8:18:44 AM By: Worthy Keeler PA-C Entered By: Worthy Keeler on 02/13/2018 11:59:05 Glenn Garcia ([id number]) -------------------------------------------------------------------------------- Physician Orders Details Patient Name: Glenn Garcia Date of Service: 02/13/2018 10:15 AM Medical Record Number: [id number] Patient Account Number: [id number] Date of Birth/Sex: 1935-12-07 (83 y.o. M) Treating RN: Harold Barban Primary Care Provider: Fulton Reek Other Clinician: Referring Provider: Referral, Self Treating Provider/Extender: Melburn Hake, HOYT Weeks in Treatment: 0 Verbal / Phone Orders: No Diagnosis Coding ICD-10 Coding Code Description F01.50 Vascular dementia without behavioral disturbance S12.100A Unspecified displaced fracture of second cervical vertebra, initial encounter for closed fracture L89.893 Pressure ulcer of other site, stage 3 I10 Essential (primary) hypertension I48.20 Chronic atrial fibrillation, unspecified Wound Cleansing Wound #1 Left,Proximal Chest o Cleanse wound with mild soap and water o May Shower, gently pat wound dry prior to applying new dressing. Primary Wound Dressing o Silver Collagen - moisten with saline Secondary Dressing Wound #1 Left,Proximal Chest o Foam - Cut out two donuts (double up) to place around wound o Non-adherent pad Dressing Change Frequency Wound #1 Left,Proximal Chest o Dressing is to be changed Monday and Thursday. - As needed if dressing becomes soiled or saturated. Follow-up Appointments Wound #1 Left,Proximal Chest o Return Appointment in 1 week. Electronic Signature(s) Signed:  02/14/2018 8:18:44 AM By: Worthy Keeler PA-C Signed: 02/16/2018 11:43:02 AM By: Harold Barban Entered By: Harold Barban on 02/13/2018 11:43:43 Evangelist, Nilda Simmer ([id number]) -------------------------------------------------------------------------------- Problem List Details Patient Name: Glenn Garcia Date of Service: 02/13/2018 10:15 AM Medical Record Number: [id number] Patient Account Number: [id number] Date of Birth/Sex: [birth year] (83 y.o. M) Treating RN: Harold Barban Primary Care Provider: Fulton Reek Other Clinician: Referring Provider: Referral, Self Treating Provider/Extender: Melburn Hake, HOYT Weeks in Treatment: 0 Active Problems ICD-10 Evaluated Encounter Code Description Active Date Today Diagnosis F01.50 Vascular dementia without behavioral disturbance 02/13/2018 No Yes S12.100A Unspecified displaced fracture of second cervical vertebra, 02/13/2018 No Yes initial encounter for closed fracture L89.893 Pressure ulcer of other  site, stage 3 02/13/2018 No Yes I10 Essential (primary) hypertension 02/13/2018 No Yes I48.20 Chronic atrial fibrillation, unspecified 02/13/2018 No Yes Inactive Problems Resolved Problems Electronic Signature(s) Signed: 02/14/2018 8:18:44 AM By: Worthy Keeler PA-C Entered By: Worthy Keeler on 02/13/2018 11:28:26 Glenn Garcia ([id number]) -------------------------------------------------------------------------------- Progress Note Details Patient Name: Glenn Garcia Date of Service: 02/13/2018 10:15 AM Medical Record Number: [id number] Patient Account Number: [id number] Date of Birth/Sex: Jun 05, 1935 (83 y.o. M) Treating RN: Harold Barban Primary Care Provider: Fulton Reek Other Clinician: Referring Provider: Referral, Self Treating Provider/Extender: Melburn Hake, HOYT Weeks in Treatment: 0 Subjective Chief Complaint Information obtained from Patient Chest wall ulcer History of Present Illness (HPI) 02/13/18 patient presents  today for initial evaluation our clinic concerning issues that he has been having with a pressure area over the collarbone on the left secondary to having to wear a cervical collar since July 2019 when he sustained a C2 cervical fracture. His son and wife have been caring for him and have done an excellent job up to this point. With that being said he does prefer to lay on his left side and I think this may be making him more prone to breakdown and pressure at the caller region on the left. Fortunately there is no signs of infection this time and there is no bone exposed although this is a very precarious location just as in fact there's not much skin overlying the area of concern. With that being said I do believe that there is no evidence of infection currently which is good news. The biggest concern is gonna be that he will continue to require offloading since he's gonna continue to we need the cervical collar in order to prevent further breakdown. Patient does have a history of fairly significant dimension along with hypertension and chronic issue fibrillation. Other than that obviously just the cervical fracture at C2 which he has been in the cervical collar for since July 2019. This injury occurred as a result of the fall. Wound History Patient presents with 2 open wounds that have been present for approximately 6 weeks. Patient has been treating wounds in the following manner: mupirocin. Laboratory tests have been performed in the last month. Patient reportedly has not tested positive for an antibiotic resistant organism. Patient reportedly has not tested positive for osteomyelitis. Patient reportedly has not had testing performed to evaluate circulation in the legs. Patient History Information obtained from Caregiver. Allergies ciprofloxacin, Lipitor, timolol, dorzolamide Family History Cancer - Father,Siblings, Diabetes - Siblings, Heart Disease - Siblings, Lung Disease - Siblings,  Stroke - Siblings, No family history of Hereditary Spherocytosis, Hypertension, Kidney Disease, Seizures, Thyroid Problems, Tuberculosis. Social History Never smoker, Marital Status - Married, Alcohol Use - Never, Drug Use - No History, Caffeine Use - Never. Medical History Eyes Patient has history of Cataracts - removed in right, Glaucoma Denies history of Optic Neuritis Ear/Nose/Mouth/Throat Denies history of Chronic sinus problems/congestion, Middle ear problems Hematologic/Lymphatic Denies history of Anemia, Hemophilia, Human Immunodeficiency Virus, Lymphedema, Sickle Cell Disease Abercrombie, Ric V. ([id number]) Respiratory Denies history of Aspiration, Asthma, Chronic Obstructive Pulmonary Disease (COPD), Pneumothorax, Sleep Apnea, Tuberculosis Cardiovascular Patient has history of Arrhythmia - a fib, Hypertension Denies history of Angina, Congestive Heart Failure, Coronary Artery Disease, Deep Vein Thrombosis, Hypotension, Myocardial Infarction, Peripheral Arterial Disease, Peripheral Venous Disease, Phlebitis, Vasculitis Gastrointestinal Denies history of Cirrhosis , Colitis, Crohn s, Hepatitis A, Hepatitis B, Hepatitis C Endocrine Denies history of Type I Diabetes, Type II Diabetes Genitourinary Denies history of  End Stage Renal Disease Immunological Denies history of Lupus Erythematosus, Raynaud s, Scleroderma Integumentary (Skin) Denies history of History of Burn, History of pressure wounds Musculoskeletal Patient has history of Osteoarthritis Denies history of Gout, Rheumatoid Arthritis, Osteomyelitis Neurologic Patient has history of Dementia Denies history of Neuropathy, Quadriplegia, Paraplegia, Seizure Disorder Oncologic Denies history of Received Chemotherapy, Received Radiation Medical And Surgical History Notes Gastrointestinal GERD Musculoskeletal fracture of c2 with chronic cervical collar Review of Systems (ROS) Constitutional Symptoms (General  Health) Denies complaints or symptoms of Fatigue, Fever, Chills, Marked Weight Change. Eyes Denies complaints or symptoms of Dry Eyes, Vision Changes, Glasses / Contacts. Ear/Nose/Mouth/Throat Denies complaints or symptoms of Difficult clearing ears, Sinusitis. Hematologic/Lymphatic Denies complaints or symptoms of Bleeding / Clotting Disorders, Human Immunodeficiency Virus. Respiratory Denies complaints or symptoms of Chronic or frequent coughs, Shortness of Breath. Cardiovascular Denies complaints or symptoms of Chest pain. Gastrointestinal Denies complaints or symptoms of Frequent diarrhea, Nausea, Vomiting. Endocrine Denies complaints or symptoms of Hepatitis, Thyroid disease, Polydypsia (Excessive Thirst). Genitourinary Denies complaints or symptoms of Kidney failure/ Dialysis, Incontinence/dribbling. Immunological Denies complaints or symptoms of Hives, Itching. Integumentary (Skin) Complains or has symptoms of Wounds. Denies complaints or symptoms of Bleeding or bruising tendency, Breakdown, Swelling. Musculoskeletal Denies complaints or symptoms of Muscle Pain, Muscle Weakness. Neurologic Denies complaints or symptoms of Numbness/parasthesias, Focal/Weakness. PRECILIANO, CASTELL ([id number]) Objective Constitutional sitting or standing blood pressure is within target range for patient.. pulse regular and within target range for patient.Marland Kitchen respirations regular, non-labored and within target range for patient.Marland Kitchen temperature within target range for patient.. Well- nourished and well-hydrated in no acute distress. Vitals Time Taken: 10:43 AM, Height: 62 in, Source: Measured, Weight: 112 lbs, Source: Measured, BMI: 20.5, Temperature: 97.5 F, Pulse: 61 bpm, Respiratory Rate: 16 breaths/min, Blood Pressure: 108/78 mmHg. Eyes conjunctiva clear no eyelid edema noted. pupils equal round and reactive to light and accommodation. Ears, Nose, Mouth, and Throat no gross abnormality of  ear auricles or external auditory canals. normal hearing noted during conversation. mucus membranes moist. Respiratory normal breathing without difficulty. clear to auscultation bilaterally. Cardiovascular regular rate and rhythm with normal S1, S2. no clubbing, cyanosis, significant edema, Gastrointestinal (GI) soft, non-tender, non-distended, +BS. no ventral hernia noted. Musculoskeletal unsteady while walking. Psychiatric Patient is not able to cooperate in decision making regarding care. Patient has dementia. patient is confused. General Notes: Patient's wound bed currently again showed to be fairly good there was some minimal Slough noted on the surface of the wound which I did mechanically debrided away with saline and gauze and the patient tolerated this today without complication. There did not appear to be any evidence of infection and no sharp debridement ended up being necessary during the evaluation today. Integumentary (Hair, Skin) Wound #1 status is Open. Original cause of wound was Pressure Injury. The wound is located on the Left,Proximal Chest. The wound measures 0.9cm length x 1.1cm width x 0.1cm depth; 0.778cm^2 area and 0.078cm^3 volume. There is Fat Layer (Subcutaneous Tissue) Exposed exposed. There is no tunneling or undermining noted. There is a medium amount of serous drainage noted. The wound margin is flat and intact. There is large (67-100%) red granulation within the wound bed. There is a small (1-33%) amount of necrotic tissue within the wound bed including Adherent Slough. The periwound skin appearance exhibited: Ecchymosis. The periwound skin appearance did not exhibit: Callus, Crepitus, Excoriation, Induration, Rash, Scarring, Dry/Scaly, Maceration, Atrophie Blanche, Cyanosis, Hemosiderin Staining, Mottled, Pallor, Rubor, Erythema. Periwound temperature was noted as No Abnormality.  Other Condition(s) Patient presents with Other Dermatologic Condition located  on the Right Chest. SAJID, RUPPERT ([id number]) General Notes: patient with reddened area from pressure on his right chest Patient presents with Scar / Keloid located on the chin. General Notes: patient with a healed wound on his chin from a nick while shaving Assessment Active Problems ICD-10 Vascular dementia without behavioral disturbance Unspecified displaced fracture of second cervical vertebra, initial encounter for closed fracture Pressure ulcer of other site, stage 3 Essential (primary) hypertension Chronic atrial fibrillation, unspecified Procedures Wound #1 Pre-procedure diagnosis of Wound #1 is a Pressure Ulcer located on the Left,Proximal Chest . There was a Chemical/Enzymatic/Mechanical debridement performed by STONE III, HOYT E., PA-C. With the following instrument(s): gauze and saline after achieving pain control using Lidocaine. Other agent used was gauze and saline. A time out was conducted at 11:38, prior to the start of the procedure. There was no bleeding. The procedure was tolerated well with a pain level of 0 throughout and a pain level of 0 following the procedure. Post Debridement Measurements: 0.9cm length x 1.1cm width x 0.1cm depth; 0.078cm^3 volume. Post debridement Stage noted as Category/Stage II. Character of Wound/Ulcer Post Debridement is improved. Post procedure Diagnosis Wound #1: Same as Pre-Procedure Plan Wound Cleansing: Wound #1 Left,Proximal Chest: Cleanse wound with mild soap and water May Shower, gently pat wound dry prior to applying new dressing. Primary Wound Dressing: Silver Collagen - moisten with saline Secondary Dressing: Wound #1 Left,Proximal Chest: Foam - Cut out two donuts (double up) to place around wound Non-adherent pad Dressing Change Frequency: Wound #1 Left,Proximal Chest: Dressing is to be changed Monday and Thursday. - As needed if dressing becomes soiled or saturated. Follow-up Appointments: MATHEO, RATHBONE  ([id number]) Wound #1 Left,Proximal Chest: Return Appointment in 1 week. The biggest issue I see at this point is that the patient is going to require significant offloading words allow this area to heal appropriately. This was discussed with patient son and wife during the evaluation today. I think the best option for this would be to initiate therapy with Prisma over the wound and then subsequently building up a doughnut ring two layers thick with phone around the area to keep the area as offloaded as possible. This can then be covered with a [place] dressing. We want to do a similar thing on the right side when there's no wound but subsequently where he is receiving pressure to this area and at risk for breaking down. Patient's families definitely in agreement with this plan and feels like that it sounds like a good way to go. Fortunately there is no signs of infection at this time and no bone exposure currently hopefully we can keep it that way. We will see how things progress over the next week. If anything changes or worsens in the meantime they can always let me know. Please see above for specific wound care orders. We will see patient for re-evaluation in 1 week(s) here in the clinic. If anything worsens or changes patient will contact our office for additional recommendations. Electronic Signature(s) Signed: 02/14/2018 8:18:44 AM By: Worthy Keeler PA-C Entered By: Worthy Keeler on 02/13/2018 12:00:27 Glenn Garcia ([id number]) -------------------------------------------------------------------------------- ROS/PFSH Details Patient Name: Glenn Garcia Date of Service: 02/13/2018 10:15 AM Medical Record Number: [id number] Patient Account Number: [id number] Date of Birth/Sex: 1935-03-17 (83 y.o. M) Treating RN: Montey Hora Primary Care Provider: Fulton Reek Other Clinician: Referring Provider: Referral, Self Treating Provider/Extender: Melburn Hake, HOYT Weeks  in  Treatment: 0 Information Obtained From Caregiver Wound History Do you currently have one or more open woundso Yes How many open wounds do you currently haveo 2 Approximately how long have you had your woundso 6 weeks How have you been treating your wound(s) until nowo mupirocin Has your wound(s) ever healed and then re-openedo No Have you had any lab work done in the past montho Yes Who ordered the lab work [facility] Have you tested positive for an antibiotic resistant organism (MRSA, VRE)o No Have you tested positive for osteomyelitis (bone infection)o No Have you had any tests for circulation on your legso No Constitutional Symptoms (General Health) Complaints and Symptoms: Negative for: Fatigue; Fever; Chills; Marked Weight Change Eyes Complaints and Symptoms: Negative for: Dry Eyes; Vision Changes; Glasses / Contacts Medical History: Positive for: Cataracts - removed in right; Glaucoma Negative for: Optic Neuritis Ear/Nose/Mouth/Throat Complaints and Symptoms: Negative for: Difficult clearing ears; Sinusitis Medical History: Negative for: Chronic sinus problems/congestion; Middle ear problems Hematologic/Lymphatic Complaints and Symptoms: Negative for: Bleeding / Clotting Disorders; Human Immunodeficiency Virus Medical History: Negative for: Anemia; Hemophilia; Human Immunodeficiency Virus; Lymphedema; Sickle Cell Disease Respiratory Complaints and Symptoms: Negative for: Chronic or frequent coughs; Shortness of Breath AYODELE, SANGALANG ([id number]) Medical History: Negative for: Aspiration; Asthma; Chronic Obstructive Pulmonary Disease (COPD); Pneumothorax; Sleep Apnea; Tuberculosis Cardiovascular Complaints and Symptoms: Negative for: Chest pain Medical History: Positive for: Arrhythmia - a fib; Hypertension Negative for: Angina; Congestive Heart Failure; Coronary Artery Disease; Deep Vein Thrombosis; Hypotension; Myocardial Infarction; Peripheral Arterial  Disease; Peripheral Venous Disease; Phlebitis; Vasculitis Gastrointestinal Complaints and Symptoms: Negative for: Frequent diarrhea; Nausea; Vomiting Medical History: Negative for: Cirrhosis ; Colitis; Crohnos; Hepatitis A; Hepatitis B; Hepatitis C Past Medical History Notes: GERD Endocrine Complaints and Symptoms: Negative for: Hepatitis; Thyroid disease; Polydypsia (Excessive Thirst) Medical History: Negative for: Type I Diabetes; Type II Diabetes Genitourinary Complaints and Symptoms: Negative for: Kidney failure/ Dialysis; Incontinence/dribbling Medical History: Negative for: End Stage Renal Disease Immunological Complaints and Symptoms: Negative for: Hives; Itching Medical History: Negative for: Lupus Erythematosus; Raynaudos; Scleroderma Integumentary (Skin) Complaints and Symptoms: Positive for: Wounds Negative for: Bleeding or bruising tendency; Breakdown; Swelling Medical History: Negative for: History of Burn; History of pressure wounds Musculoskeletal URBANO, MILHOUSE ([id number]) Complaints and Symptoms: Negative for: Muscle Pain; Muscle Weakness Medical History: Positive for: Osteoarthritis Negative for: Gout; Rheumatoid Arthritis; Osteomyelitis Past Medical History Notes: fracture of c2 with chronic cervical collar Neurologic Complaints and Symptoms: Negative for: Numbness/parasthesias; Focal/Weakness Medical History: Positive for: Dementia Negative for: Neuropathy; Quadriplegia; Paraplegia; Seizure Disorder Oncologic Medical History: Negative for: Received Chemotherapy; Received Radiation HBO Extended History Items Eyes: Eyes: Cataracts Glaucoma Immunizations Pneumococcal Vaccine: Received Pneumococcal Vaccination: Yes Immunization Notes: up to date Implantable Devices Family and Social History Cancer: Yes - Father,Siblings; Diabetes: Yes - Siblings; Heart Disease: Yes - Siblings; Hereditary Spherocytosis: No; Hypertension: No; Kidney Disease:  No; Lung Disease: Yes - Siblings; Seizures: No; Stroke: Yes - Siblings; Thyroid Problems: No; Tuberculosis: No; Never smoker; Marital Status - Married; Alcohol Use: Never; Drug Use: No History; Caffeine Use: Never; Financial Concerns: No; Food, Clothing or Shelter Needs: No; Support System Lacking: No; Transportation Concerns: No; Advanced Directives: Yes; Living Will: Yes; [place]: Yes - spouse Electronic Signature(s) Signed: 02/13/2018 4:27:23 PM By: Montey Hora Signed: 02/14/2018 8:18:44 AM By: Worthy Keeler PA-C Entered By: Montey Hora on 02/13/2018 10:58:15 Glenn Garcia ([id number]) -------------------------------------------------------------------------------- SuperBill Details Patient Name: Glenn Garcia Date of Service: 02/13/2018 Medical Record  Number: [id number] Patient Account Number: [id number] Date of Birth/Sex: [birth year] (83 y.o. M) Treating RN: Harold Barban Primary Care Provider: Fulton Reek Other Clinician: Referring Provider: Referral, Self Treating Provider/Extender: Melburn Hake, HOYT Weeks in Treatment: 0 Diagnosis Coding ICD-10 Codes Code Description F01.50 Vascular dementia without behavioral disturbance S12.100A Unspecified displaced fracture of second cervical vertebra, initial encounter for closed fracture L89.893 Pressure ulcer of other site, stage 3 I10 Essential (primary) hypertension I48.20 Chronic atrial fibrillation, unspecified Facility Procedures CPT4 Code: [id number] Description: 99213 - WOUND CARE VISIT-LEV 3 EST PT Modifier: Quantity: 1 Physician Procedures CPT4: Description Modifier Quantity Code [id number] [id number] - WC PHYS LEVEL 4 - EST PT 1 ICD-10 Diagnosis Description F01.50 Vascular dementia without behavioral disturbance S12.100A Unspecified displaced fracture of second cervical vertebra, initial  encounter for closed fracture L89.893 Pressure ulcer of other site, stage 3 I10 Essential (primary)  hypertension Electronic Signature(s) Signed: 02/14/2018 8:18:44 AM By: Worthy Keeler PA-C Entered By: Worthy Keeler on 02/13/2018 12:00:44

## 2018-02-17 NOTE — Progress Notes (Signed)
MACGUIRE, HOLSINGER ([id number]) Visit Report for 02/13/2018 Allergy List Details Patient Name: Glenn Garcia, Glenn Garcia. Date of Service: 02/13/2018 10:15 AM Medical Record Number: [id number] Patient Account Number: [id number] Date of Birth/Sex: December 11, 1935 (84 y.o. M) Treating RN: Montey Hora Primary Care Brya Simerly: Fulton Reek Other Clinician: Referring Bastien Strawser: Referral, Self Treating Fariha Goto/Extender: Melburn Hake, HOYT Weeks in Treatment: 0 Allergies Active Allergies ciprofloxacin Lipitor timolol dorzolamide Allergy Notes Electronic Signature(s) Signed: 02/13/2018 4:27:23 PM By: Montey Hora Entered By: Montey Hora on 02/13/2018 10:46:35 Glenn Garcia ([id number]) -------------------------------------------------------------------------------- Arrival Information Details Patient Name: Glenn Garcia Date of Service: 02/13/2018 10:15 AM Medical Record Number: [id number] Patient Account Number: [id number] Date of Birth/Sex: 05-Aug-1935 (83 y.o. M) Treating RN: Montey Hora Primary Care Jaxx Huish: Fulton Reek Other Clinician: Referring Shantil Vallejo: Referral, Self Treating Tenicia Gural/Extender: Melburn Hake, HOYT Weeks in Treatment: 0 Visit Information Patient Arrived: Ambulatory Arrival Time: 10:41 Accompanied By: spouse and son Transfer Assistance: None Patient Identification Verified: Yes Secondary Verification Process Yes Completed: Patient Has Alerts: Yes Patient Alerts: Patient on Blood Thinner Xarelto Electronic Signature(s) Signed: 02/13/2018 4:27:23 PM By: Montey Hora Entered By: Montey Hora on 02/13/2018 10:43:17 Glenn Garcia ([id number]) -------------------------------------------------------------------------------- Clinic Level of Care Assessment Details Patient Name: Glenn Garcia Date of Service: 02/13/2018 10:15 AM Medical Record Number: [id number] Patient Account Number: [id number] Date of Birth/Sex: Oct 20, 1935 (83 y.o. M) Treating RN:  Harold Barban Primary Care Taite Schoeppner: Fulton Reek Other Clinician: Referring Ziah Leandro: Referral, Self Treating Kaliegh Willadsen/Extender: Melburn Hake, HOYT Weeks in Treatment: 0 Clinic Level of Care Assessment Items TOOL 2 Quantity Score []  - Use when only an EandM is performed on the INITIAL visit 0 ASSESSMENTS - Nursing Assessment / Reassessment X - General Physical Exam (combine w/ comprehensive assessment (listed just below) when 1 20 performed on new pt. evals) X- 1 25 Comprehensive Assessment (HX, ROS, Risk Assessments, Wounds Hx, etc.) ASSESSMENTS - Wound and Skin Assessment / Reassessment X - Simple Wound Assessment / Reassessment - one wound 1 5 []  - 0 Complex Wound Assessment / Reassessment - multiple wounds []  - 0 Dermatologic / Skin Assessment (not related to wound area) ASSESSMENTS - Ostomy and/or Continence Assessment and Care []  - Incontinence Assessment and Management 0 []  - 0 Ostomy Care Assessment and Management (repouching, etc.) PROCESS - Coordination of Care X - Simple Patient / Family Education for ongoing care 1 15 []  - 0 Complex (extensive) Patient / Family Education for ongoing care X- 1 10 Staff obtains Programmer, systems, Records, Test Results / Process Orders []  - 0 Staff telephones HHA, Nursing Homes / Clarify orders / etc []  - 0 Routine Transfer to another Facility (non-emergent condition) []  - 0 Routine Hospital Admission (non-emergent condition) []  - 0 New Admissions / Biomedical engineer / Ordering NPWT, Apligraf, etc. []  - 0 Emergency Hospital Admission (emergent condition) X- 1 10 Simple Discharge Coordination []  - 0 Complex (extensive) Discharge Coordination PROCESS - Special Needs []  - Pediatric / Minor Patient Management 0 []  - 0 Isolation Patient Management Glenn Garcia, Glenn Garcia ([id number]) []  - 0 Hearing / Language / Visual special needs []  - 0 Assessment of Community assistance (transportation, D/C planning, etc.) []  - 0 Additional  assistance / Altered mentation []  - 0 Support Surface(s) Assessment (bed, cushion, seat, etc.) INTERVENTIONS - Wound Cleansing / Measurement X - Wound Imaging (photographs - any number of wounds) 1 5 []  - 0 Wound Tracing (instead of photographs) X- 1 5 Simple Wound Measurement - one wound []  - 0 Complex Wound Measurement - multiple wounds  X- 1 5 Simple Wound Cleansing - one wound []  - 0 Complex Wound Cleansing - multiple wounds INTERVENTIONS - Wound Dressings X - Small Wound Dressing one or multiple wounds 1 10 []  - 0 Medium Wound Dressing one or multiple wounds []  - 0 Large Wound Dressing one or multiple wounds []  - 0 Application of Medications - injection INTERVENTIONS - Miscellaneous []  - External ear exam 0 []  - 0 Specimen Collection (cultures, biopsies, blood, body fluids, etc.) []  - 0 Specimen(s) / Culture(s) sent or taken to Lab for analysis []  - 0 Patient Transfer (multiple staff / Civil Service fast streamer / Similar devices) []  - 0 Simple Staple / Suture removal (25 or less) []  - 0 Complex Staple / Suture removal (26 or more) []  - 0 Hypo / Hyperglycemic Management (close monitor of Blood Glucose) []  - 0 Ankle / Brachial Index (ABI) - do not check if billed separately Has the patient been seen at the hospital within the last three years: Yes Total Score: 110 Level Of Care: New/Established - Level 3 Electronic Signature(s) Signed: 02/16/2018 11:43:02 AM By: Harold Barban Entered By: Harold Barban on 02/13/2018 11:36:47 Glenn Garcia, Glenn Garcia ([id number]) -------------------------------------------------------------------------------- Encounter Discharge Information Details Patient Name: Glenn Garcia Date of Service: 02/13/2018 10:15 AM Medical Record Number: [id number] Patient Account Number: [id number] Date of Birth/Sex: 1935/07/25 (82 y.o. M) Treating RN: Montey Hora Primary Care Janesa Dockery: Fulton Reek Other Clinician: Referring Vikki Gains: Referral, Self Treating  Gradie Ohm/Extender: Melburn Hake, HOYT Weeks in Treatment: 0 Encounter Discharge Information Items Post Procedure Vitals Discharge Condition: Stable Temperature (F): 97.5 Ambulatory Status: Ambulatory Pulse (bpm): 61 Discharge Destination: Home Respiratory Rate (breaths/min): 16 Transportation: Private Auto Blood Pressure (mmHg): 108/78 Accompanied By: spouse and son Schedule Follow-up Appointment: Yes Clinical Summary of Care: Electronic Signature(s) Signed: 02/13/2018 4:27:23 PM By: Montey Hora Entered By: Montey Hora on 02/13/2018 11:55:19 Glenn Garcia ([id number]) -------------------------------------------------------------------------------- Lower Extremity Assessment Details Patient Name: Glenn Garcia Date of Service: 02/13/2018 10:15 AM Medical Record Number: [id number] Patient Account Number: [id number] Date of Birth/Sex: 04-Jan-1936 (83 y.o. M) Treating RN: Montey Hora Primary Care Kenwood Rosiak: Fulton Reek Other Clinician: Referring Leng Montesdeoca: Referral, Self Treating Jw Covin/Extender: Melburn Hake, HOYT Weeks in Treatment: 0 Electronic Signature(s) Signed: 02/13/2018 4:27:23 PM By: Montey Hora Entered By: Montey Hora on 02/13/2018 10:47:58 Glenn Garcia, Glenn Garcia ([id number]) -------------------------------------------------------------------------------- Multi Wound Chart Details Patient Name: Glenn Garcia Date of Service: 02/13/2018 10:15 AM Medical Record Number: [id number] Patient Account Number: [id number] Date of Birth/Sex: [birth year] (83 y.o. M) Treating RN: Harold Barban Primary Care Phillippe Orlick: Fulton Reek Other Clinician: Referring Tatelyn Vanhecke: Referral, Self Treating Tzippy Testerman/Extender: Melburn Hake, HOYT Weeks in Treatment: 0 Vital Signs Height(in): 62 Pulse(bpm): 61 Weight(lbs): 112 Blood Pressure(mmHg): 108/78 Body Mass Index(BMI): 20 Temperature(F): 97.5 Respiratory Rate 16 (breaths/min): Photos: [N/A:N/A] Wound Location: Left  Chest - Proximal N/A N/A Wounding Event: Pressure Injury N/A N/A Primary Etiology: Pressure Ulcer N/A N/A Comorbid History: Cataracts, Glaucoma, N/A N/A Arrhythmia, Hypertension, Osteoarthritis, Dementia Date Acquired: 12/12/2017 N/A N/A Weeks of Treatment: 0 N/A N/A Wound Status: Open N/A N/A Measurements L x W x D 0.9x1.1x0.1 N/A N/A (cm) Area (cm) : 0.778 N/A N/A Volume (cm) : 0.078 N/A N/A Classification: Category/Stage II N/A N/A Exudate Amount: Medium N/A N/A Exudate Type: Serous N/A N/A Exudate Color: amber N/A N/A Wound Margin: Flat and Intact N/A N/A Granulation Amount: Small (1-33%) N/A N/A Granulation Quality: Red N/A N/A Necrotic Amount: Large (67-100%) N/A N/A Exposed Structures: Fat Layer (Subcutaneous N/A N/A Tissue) Exposed: Yes Fascia: No  Tendon: No Muscle: No Joint: No Bone: No Epithelialization: None N/A N/A Glenn Garcia, Glenn V. ([id number]) Periwound Skin Texture: Excoriation: No N/A N/A Induration: No Callus: No Crepitus: No Rash: No Scarring: No Periwound Skin Moisture: Maceration: No N/A N/A Dry/Scaly: No Periwound Skin Color: Ecchymosis: Yes N/A N/A Atrophie Blanche: No Cyanosis: No Erythema: No Hemosiderin Staining: No Mottled: No Pallor: No Rubor: No Temperature: No Abnormality N/A N/A Tenderness on Palpation: No N/A N/A Wound Preparation: Ulcer Cleansing: N/A N/A Rinsed/Irrigated with Saline Topical Anesthetic Applied: Other: lidocaine 4% Treatment Notes Electronic Signature(s) Signed: 02/16/2018 11:43:02 AM By: Harold Barban Entered By: Harold Barban on 02/13/2018 11:30:38 Glenn Garcia ([id number]) -------------------------------------------------------------------------------- Yell Details Patient Name: Glenn Garcia Date of Service: 02/13/2018 10:15 AM Medical Record Number: [id number] Patient Account Number: [id number] Date of Birth/Sex: Oct 09, 1935 (83 y.o. M) Treating RN: Harold Barban Primary Care Jayd Forrey: Fulton Reek Other Clinician: Referring Nicolasa Milbrath: Referral, Self Treating Korban Shearer/Extender: Melburn Hake, HOYT Weeks in Treatment: 0 Active Inactive Wound/Skin Impairment Nursing Diagnoses: Impaired tissue integrity Knowledge deficit related to ulceration/compromised skin integrity Goals: Ulcer/skin breakdown will have a volume reduction of 30% by week 4 Date Initiated: 02/13/2018 Target Resolution Date: 03/14/2018 Goal Status: Active Interventions: Assess patient/caregiver ability to obtain necessary supplies Assess patient/caregiver ability to perform ulcer/skin care regimen upon admission and as needed Assess ulceration(s) every visit Provide education on ulcer and skin care Notes: Electronic Signature(s) Signed: 02/16/2018 11:43:02 AM By: Harold Barban Entered By: Harold Barban on 02/13/2018 11:30:12 Glenn Garcia, Glenn Garcia ([id number]) -------------------------------------------------------------------------------- Non-Wound Condition Assessment Details Patient Name: Glenn Garcia Date of Service: 02/13/2018 10:15 AM Medical Record Number: [id number] Patient Account Number: [id number] Date of Birth/Sex: Mar 29, 1935 (83 y.o. M) Treating RN: Montey Hora Primary Care Trevan Messman: Fulton Reek Other Clinician: Referring Rome Schlauch: Referral, Self Treating Maylea Soria/Extender: Melburn Hake, HOYT Weeks in Treatment: 0 Non-Wound Condition: Condition: Other Dermatologic Condition Location: Chest Side: Right Photos Photo Uploaded By: Montey Hora on 02/13/2018 11:13:47 Periwound Skin Texture Texture Color No Abnormalities Noted: No No Abnormalities Noted: No Moisture No Abnormalities Noted: No Notes patient with reddened area from pressure on his right chest Electronic Signature(s) Signed: 02/13/2018 4:27:23 PM By: Montey Hora Entered By: Montey Hora on 02/13/2018 11:10:12 Glenn Garcia, Glenn Garcia  ([id number]) -------------------------------------------------------------------------------- Non-Wound Condition Assessment Details Patient Name: Glenn Garcia Date of Service: 02/13/2018 10:15 AM Medical Record Number: [id number] Patient Account Number: [id number] Date of Birth/Sex: Jul 29, 1935 (83 y.o. M) Treating RN: Montey Hora Primary Care Iasiah Ozment: Fulton Reek Other Clinician: Referring Catrice Zuleta: Referral, Self Treating Lashara Urey/Extender: STONE III, HOYT Weeks in Treatment: 0 Non-Wound Condition: Condition: Scar / Keloid Location: Other: chin Side: Photos Photo Uploaded By: Montey Hora on 02/13/2018 11:13:48 Periwound Skin Texture Texture Color No Abnormalities Noted: No No Abnormalities Noted: No Moisture No Abnormalities Noted: No Notes patient with a healed wound on his chin from a nick while shaving Electronic Signature(s) Signed: 02/13/2018 4:27:23 PM By: Montey Hora Entered By: Montey Hora on 02/13/2018 11:11:15 Glenn Garcia ([id number]) -------------------------------------------------------------------------------- Pain Assessment Details Patient Name: Glenn Garcia Date of Service: 02/13/2018 10:15 AM Medical Record Number: [id number] Patient Account Number: [id number] Date of Birth/Sex: [birth year] (83 y.o. M) Treating RN: Montey Hora Primary Care Florella Mcneese: Fulton Reek Other Clinician: Referring Yajaira Doffing: Referral, Self Treating Glenn Garcia/Extender: Melburn Hake, HOYT Weeks in Treatment: 0 Active Problems Location of Pain Severity and Description of Pain Patient Has Paino No Site Locations Pain Management and Medication Current Pain Management: Electronic Signature(s) Signed: 02/13/2018 4:27:23 PM By: Montey Hora Entered By: Glenn Garcia,  Glenn Garcia on 02/13/2018 10:43:04 Glenn Garcia ([id number]) -------------------------------------------------------------------------------- Patient/Caregiver Education Details Patient Name:  Glenn Garcia Date of Service: 02/13/2018 10:15 AM Medical Record Number: [id number] Patient Account Number: [id number] Date of Birth/Gender: Dec 25, 1935 (83 y.o. M) Treating RN: Harold Barban Primary Care Physician: Fulton Reek Other Clinician: Referring Physician: Referral, Self Treating Physician/Extender: Sharalyn Ink in Treatment: 0 Education Assessment Education Provided To: Caregiver Education Topics Provided Welcome To [facility]: Methods: Demonstration, Explain/Verbal Responses: State content correctly Wound/Skin Impairment: Handouts: Caring for Your Ulcer Methods: Demonstration, Explain/Verbal Responses: State content correctly Electronic Signature(s) Signed: 02/16/2018 11:43:02 AM By: Harold Barban Entered By: Harold Barban on 02/13/2018 11:31:12 Glenn Garcia ([id number]) -------------------------------------------------------------------------------- Wound Assessment Details Patient Name: Glenn Garcia Date of Service: 02/13/2018 10:15 AM Medical Record Number: [id number] Patient Account Number: [id number] Date of Birth/Sex: 1935-08-27 (83 y.o. M) Treating RN: Montey Hora Primary Care Tasfia Vasseur: Fulton Reek Other Clinician: Referring Lanice Folden: Referral, Self Treating Ranvir Renovato/Extender: Melburn Hake, HOYT Weeks in Treatment: 0 Wound Status Wound Number: 1 Primary Pressure Ulcer Etiology: Wound Location: Left Chest - Proximal Wound Open Wounding Event: Pressure Injury Status: Date Acquired: 12/12/2017 Comorbid Cataracts, Glaucoma, Arrhythmia, Weeks Of Treatment: 0 History: Hypertension, Osteoarthritis, Dementia Clustered Wound: No Photos Wound Measurements Length: (cm) 0.9 % Reduction Width: (cm) 1.1 % Reduction Depth: (cm) 0.1 Epithelializ Area: (cm) 0.778 Tunneling: Volume: (cm) 0.078 Undermining in Area: 0% in Volume: 0% ation: None No : No Wound Description Classification: Category/Stage III Foul Odor  Af Wound Margin: Flat and Intact Slough/Fibri Exudate Amount: Medium Exudate Type: Serous Exudate Color: amber ter Cleansing: No no Yes Wound Bed Granulation Amount: Large (67-100%) Exposed Structure Granulation Quality: Red Fascia Exposed: No Necrotic Amount: Small (1-33%) Fat Layer (Subcutaneous Tissue) Exposed: Yes Necrotic Quality: Adherent Slough Tendon Exposed: No Muscle Exposed: No Joint Exposed: No Bone Exposed: No Periwound Skin Texture Texture Color Manrique, Glenn V. ([id number]) No Abnormalities Noted: No No Abnormalities Noted: No Callus: No Atrophie Blanche: No Crepitus: No Cyanosis: No Excoriation: No Ecchymosis: Yes Induration: No Erythema: No Rash: No Hemosiderin Staining: No Scarring: No Mottled: No Pallor: No Moisture Rubor: No No Abnormalities Noted: No Dry / Scaly: No Temperature / Pain Maceration: No Temperature: No Abnormality Wound Preparation Ulcer Cleansing: Rinsed/Irrigated with Saline Topical Anesthetic Applied: Other: lidocaine 4%, Treatment Notes Wound #1 (Left, Proximal Chest) Notes prisma, foam and telfa Manufacturing systems engineer) Signed: 02/13/2018 4:27:23 PM By: Montey Hora Signed: 02/14/2018 8:18:44 AM By: Glenn Garcia Entered By: Glenn Keeler on 02/13/2018 11:55:47 Vasil, Glenn Garcia ([id number]) -------------------------------------------------------------------------------- Vitals Details Patient Name: Glenn Garcia Date of Service: 02/13/2018 10:15 AM Medical Record Number: [id number] Patient Account Number: [id number] Date of Birth/Sex: 1935/11/22 (83 y.o. M) Treating RN: Montey Hora Primary Care Chlora Mcbain: Fulton Reek Other Clinician: Referring Marilyn Wing: Referral, Self Treating Ashwika Freels/Extender: Melburn Hake, HOYT Weeks in Treatment: 0 Vital Signs Time Taken: 10:43 Temperature (F): 97.5 Height (in): 62 Pulse (bpm): 61 Source: Measured Respiratory Rate (breaths/min): 16 Weight (lbs):  112 Blood Pressure (mmHg): 108/78 Source: Measured Reference Range: 80 - 120 mg / dl Body Mass Index (BMI): 20.5 Airway Electronic Signature(s) Signed: 02/13/2018 4:27:23 PM By: Montey Hora Entered By: Montey Hora on 02/13/2018 10:45:32

## 2018-02-20 ENCOUNTER — Ambulatory Visit: Payer: Medicare HMO | Admitting: Physician Assistant

## 2018-02-21 ENCOUNTER — Encounter: Payer: Medicare HMO | Admitting: Physician Assistant

## 2018-02-21 DIAGNOSIS — E11622 Type 2 diabetes mellitus with other skin ulcer: Secondary | ICD-10-CM | POA: Diagnosis not present

## 2018-02-27 NOTE — Progress Notes (Signed)
IKEY, OMARY ([id number]) Visit Report for 02/21/2018 Arrival Information Details Patient Name: Glenn Garcia, Glenn Garcia. Date of Service: 02/21/2018 3:00 PM Medical Record Number: [id number] Patient Account Number: [id number] Date of Birth/Sex: 09-13-1935 (83 y.o. M) Treating RN: Harold Barban Primary Care Tyeler Goedken: Fulton Reek Other Clinician: Referring Alverna Fawley: Fulton Reek Treating Flavia Bruss/Extender: Melburn Hake, HOYT Weeks in Treatment: 1 Visit Information History Since Last Visit Added or deleted any medications: No Patient Arrived: Ambulatory Any new allergies or adverse reactions: No Arrival Time: 15:06 Had a fall or experienced change in No Accompanied By: family activities of daily living that may affect Transfer Assistance: None risk of falls: Patient Identification Verified: Yes Signs or symptoms of abuse/neglect since last visito No Secondary Verification Process Yes Hospitalized since last visit: No Completed: Pain Present Now: No Patient Has Alerts: Yes Patient Alerts: Patient on Blood Thinner Xarelto Electronic Signature(s) Signed: 02/27/2018 9:36:27 AM By: Harold Barban Entered By: Harold Barban on 02/21/2018 15:11:21 Glenn Garcia ([id number]) -------------------------------------------------------------------------------- Clinic Level of Care Assessment Details Patient Name: Glenn Garcia Date of Service: 02/21/2018 3:00 PM Medical Record Number: [id number] Patient Account Number: [id number] Date of Birth/Sex: 1935-02-01 (83 y.o. M) Treating RN: Montey Hora Primary Care Kissy Cielo: Fulton Reek Other Clinician: Referring Yaileen Hofferber: Fulton Reek Treating Denzell Colasanti/Extender: Melburn Hake, HOYT Weeks in Treatment: 1 Clinic Level of Care Assessment Items TOOL 4 Quantity Score []  - Use when only an EandM is performed on FOLLOW-UP visit 0 ASSESSMENTS - Nursing Assessment / Reassessment X - Reassessment of Co-morbidities (includes updates  in patient status) 1 10 X- 1 5 Reassessment of Adherence to Treatment Plan ASSESSMENTS - Wound and Skin Assessment / Reassessment []  - Simple Wound Assessment / Reassessment - one wound 0 X- 2 5 Complex Wound Assessment / Reassessment - multiple wounds []  - 0 Dermatologic / Skin Assessment (not related to wound area) ASSESSMENTS - Focused Assessment []  - Circumferential Edema Measurements - multi extremities 0 []  - 0 Nutritional Assessment / Counseling / Intervention []  - 0 Lower Extremity Assessment (monofilament, tuning fork, pulses) []  - 0 Peripheral Arterial Disease Assessment (using hand held doppler) ASSESSMENTS - Ostomy and/or Continence Assessment and Care []  - Incontinence Assessment and Management 0 []  - 0 Ostomy Care Assessment and Management (repouching, etc.) PROCESS - Coordination of Care X - Simple Patient / Family Education for ongoing care 1 15 []  - 0 Complex (extensive) Patient / Family Education for ongoing care X- 1 10 Staff obtains Programmer, systems, Records, Test Results / Process Orders []  - 0 Staff telephones HHA, Nursing Homes / Clarify orders / etc []  - 0 Routine Transfer to another Facility (non-emergent condition) []  - 0 Routine Hospital Admission (non-emergent condition) []  - 0 New Admissions / Biomedical engineer / Ordering NPWT, Apligraf, etc. []  - 0 Emergency Hospital Admission (emergent condition) X- 1 10 Simple Discharge Coordination Glenn Garcia, Glenn Garcia ([id number]) []  - 0 Complex (extensive) Discharge Coordination PROCESS - Special Needs []  - Pediatric / Minor Patient Management 0 []  - 0 Isolation Patient Management []  - 0 Hearing / Language / Visual special needs []  - 0 Assessment of Community assistance (transportation, D/C planning, etc.) []  - 0 Additional assistance / Altered mentation []  - 0 Support Surface(s) Assessment (bed, cushion, seat, etc.) INTERVENTIONS - Wound Cleansing / Measurement []  - Simple Wound Cleansing - one  wound 0 X- 2 5 Complex Wound Cleansing - multiple wounds X- 1 5 Wound Imaging (photographs - any number of wounds) []  - 0 Wound Tracing (instead of photographs) []  - 0  Simple Wound Measurement - one wound X- 2 5 Complex Wound Measurement - multiple wounds INTERVENTIONS - Wound Dressings X - Small Wound Dressing one or multiple wounds 2 10 []  - 0 Medium Wound Dressing one or multiple wounds []  - 0 Large Wound Dressing one or multiple wounds []  - 0 Application of Medications - topical []  - 0 Application of Medications - injection INTERVENTIONS - Miscellaneous []  - External ear exam 0 []  - 0 Specimen Collection (cultures, biopsies, blood, body fluids, etc.) []  - 0 Specimen(s) / Culture(s) sent or taken to Lab for analysis []  - 0 Patient Transfer (multiple staff / Civil Service fast streamer / Similar devices) []  - 0 Simple Staple / Suture removal (25 or less) []  - 0 Complex Staple / Suture removal (26 or more) []  - 0 Hypo / Hyperglycemic Management (close monitor of Blood Glucose) []  - 0 Ankle / Brachial Index (ABI) - do not check if billed separately X- 1 5 Vital Signs Glenn Garcia, Glenn V. ([id number]) Has the patient been seen at the hospital within the last three years: Yes Total Score: 110 Level Of Care: New/Established - Level 3 Electronic Signature(s) Signed: 02/21/2018 4:52:44 PM By: Montey Hora Entered By: Montey Hora on 02/21/2018 15:57:26 Glenn Garcia, Glenn Garcia ([id number]) -------------------------------------------------------------------------------- Encounter Discharge Information Details Patient Name: Glenn Garcia Date of Service: 02/21/2018 3:00 PM Medical Record Number: [id number] Patient Account Number: [id number] Date of Birth/Sex: May 24, 1935 (83 y.o. M) Treating RN: Montey Hora Primary Care Harshil Cavallaro: Fulton Reek Other Clinician: Referring Jyair Kiraly: Fulton Reek Treating Syann Cupples/Extender: Melburn Hake, HOYT Weeks in Treatment: 1 Encounter Discharge  Information Items Discharge Condition: Stable Ambulatory Status: Walker Discharge Destination: Home Transportation: Private Auto Accompanied By: self Schedule Follow-up Appointment: Yes Clinical Summary of Care: Electronic Signature(s) Signed: 02/21/2018 4:52:44 PM By: Montey Hora Entered By: Montey Hora on 02/21/2018 15:58:19 Glenn Garcia ([id number]) -------------------------------------------------------------------------------- Lower Extremity Assessment Details Patient Name: Glenn Garcia Date of Service: 02/21/2018 3:00 PM Medical Record Number: [id number] Patient Account Number: [id number] Date of Birth/Sex: 25-Jul-1935 (83 y.o. M) Treating RN: Harold Barban Primary Care Laurance Heide: Fulton Reek Other Clinician: Referring Isabeau Mccalla: Fulton Reek Treating Wallice Granville/Extender: Melburn Hake, HOYT Weeks in Treatment: 1 Electronic Signature(s) Signed: 02/27/2018 9:36:27 AM By: Harold Barban Entered By: Harold Barban on 02/21/2018 15:18:29 Glenn Garcia ([id number]) -------------------------------------------------------------------------------- Multi Wound Chart Details Patient Name: Glenn Garcia Date of Service: 02/21/2018 3:00 PM Medical Record Number: [id number] Patient Account Number: [id number] Date of Birth/Sex: 1935/09/09 (82 y.o. M) Treating RN: Montey Hora Primary Care John Vasconcelos: Fulton Reek Other Clinician: Referring Antavion Bartoszek: Fulton Reek Treating Brent Taillon/Extender: Melburn Hake, HOYT Weeks in Treatment: 1 Vital Signs Height(in): 62 Pulse(bpm): 61 Weight(lbs): 112 Blood Pressure(mmHg): 112/81 Body Mass Index(BMI): 20 Temperature(F): Respiratory Rate 18 (breaths/min): Photos: [1:No Photos] [N/A:N/A] Wound Location: [1:Left Chest - Proximal] [N/A:N/A] Wounding Event: [1:Pressure Injury] [N/A:N/A] Primary Etiology: [1:Pressure Ulcer] [N/A:N/A] Comorbid History: [1:Cataracts, Glaucoma, Arrhythmia, Hypertension,  Osteoarthritis, Dementia] [N/A:N/A] Date Acquired: [1:12/12/2017] [N/A:N/A] Weeks of Treatment: [1:1] [N/A:N/A] Wound Status: [1:Open] [N/A:N/A] Measurements L x W x D [1:0.5x0.5x0.1] [N/A:N/A] (cm) Area (cm) : [1:0.196] [N/A:N/A] Volume (cm) : [1:0.02] [N/A:N/A] % Reduction in Area: [1:74.80%] [N/A:N/A] % Reduction in Volume: [1:74.40%] [N/A:N/A] Classification: [1:Category/Stage III] [N/A:N/A] Exudate Amount: [1:Medium] [N/A:N/A] Exudate Type: [1:Serous] [N/A:N/A] Exudate Color: [1:amber] [N/A:N/A] Wound Margin: [1:Flat and Intact] [N/A:N/A] Granulation Amount: [1:Large (67-100%)] [N/A:N/A] Granulation Quality: [1:Red] [N/A:N/A] Necrotic Amount: [1:Small (1-33%)] [N/A:N/A] Exposed Structures: [1:Fat Layer (Subcutaneous Tissue) Exposed: Yes Fascia: No Tendon: No Muscle: No Joint: No Bone: No] [N/A:N/A] Epithelialization: [1:None] [N/A:N/A]  Periwound Skin Texture: [1:Excoriation: No Induration: No Callus: No Crepitus: No] [N/A:N/A] Rash: No Scarring: No Periwound Skin Moisture: Maceration: No N/A N/A Dry/Scaly: No Periwound Skin Color: Ecchymosis: Yes N/A N/A Atrophie Blanche: No Cyanosis: No Erythema: No Hemosiderin Staining: No Mottled: No Pallor: No Rubor: No Temperature: No Abnormality N/A N/A Tenderness on Palpation: No N/A N/A Wound Preparation: Ulcer Cleansing: N/A N/A Rinsed/Irrigated with Saline Topical Anesthetic Applied: Other: lidocaine 4% Treatment Notes Electronic Signature(s) Signed: 02/21/2018 4:52:44 PM By: Montey Hora Entered By: Montey Hora on 02/21/2018 15:45:04 Glenn Garcia ([id number]) -------------------------------------------------------------------------------- [place] Details Patient Name: Glenn Garcia Date of Service: 02/21/2018 3:00 PM Medical Record Number: [id number] Patient Account Number: [id number] Date of Birth/Sex: 09/26/1935 (83 y.o. M) Treating RN: Montey Hora Primary Care Nare Gaspari:  Fulton Reek Other Clinician: Referring Chaz Mcglasson: Fulton Reek Treating Lizvette Lightsey/Extender: Melburn Hake, HOYT Weeks in Treatment: 1 Active Inactive Wound/Skin Impairment Nursing Diagnoses: Impaired tissue integrity Knowledge deficit related to ulceration/compromised skin integrity Goals: Ulcer/skin breakdown will have a volume reduction of 30% by week 4 Date Initiated: 02/13/2018 Target Resolution Date: 03/14/2018 Goal Status: Active Interventions: Assess patient/caregiver ability to obtain necessary supplies Assess patient/caregiver ability to perform ulcer/skin care regimen upon admission and as needed Assess ulceration(s) every visit Provide education on ulcer and skin care Notes: Electronic Signature(s) Signed: 02/21/2018 4:52:44 PM By: Montey Hora Entered By: Montey Hora on 02/21/2018 15:44:56 Glenn Garcia, Glenn Garcia ([id number]) -------------------------------------------------------------------------------- Pain Assessment Details Patient Name: Glenn Garcia Date of Service: 02/21/2018 3:00 PM Medical Record Number: [id number] Patient Account Number: [id number] Date of Birth/Sex: Dec 03, 1935 (83 y.o. M) Treating RN: Harold Barban Primary Care Hargun Spurling: Fulton Reek Other Clinician: Referring Drystan Reader: Fulton Reek Treating Gleb Mcguire/Extender: Melburn Hake, HOYT Weeks in Treatment: 1 Active Problems Location of Pain Severity and Description of Pain Patient Has Paino No Site Locations Pain Management and Medication Current Pain Management: Electronic Signature(s) Signed: 02/27/2018 9:36:27 AM By: Harold Barban Entered By: Harold Barban on 02/21/2018 15:11:34 Glenn Garcia ([id number]) -------------------------------------------------------------------------------- Patient/Caregiver Education Details Patient Name: Glenn Garcia Date of Service: 02/21/2018 3:00 PM Medical Record Number: [id number] Patient Account Number: [id number] Date of  Birth/Gender: 07-19-1935 (83 y.o. M) Treating RN: Montey Hora Primary Care Physician: Fulton Reek Other Clinician: Referring Physician: Fulton Reek Treating Physician/Extender: Sharalyn Ink in Treatment: 1 Education Assessment Education Provided To: Caregiver Education Topics Provided Wound/Skin Impairment: Handouts: Other: wound care as ordered Methods: Demonstration, Explain/Verbal Responses: State content correctly Electronic Signature(s) Signed: 02/21/2018 4:52:44 PM By: Montey Hora Entered By: Montey Hora on 02/21/2018 15:58:38 Glenn Garcia ([id number]) -------------------------------------------------------------------------------- Wound Assessment Details Patient Name: Glenn Garcia Date of Service: 02/21/2018 3:00 PM Medical Record Number: [id number] Patient Account Number: [id number] Date of Birth/Sex: 1935-10-08 (83 y.o. M) Treating RN: Harold Barban Primary Care Hennessy Bartel: Fulton Reek Other Clinician: Referring Kaylem Gidney: Fulton Reek Treating Dora Simeone/Extender: Melburn Hake, HOYT Weeks in Treatment: 1 Wound Status Wound Number: 1 Primary Pressure Ulcer Etiology: Wound Location: Left Chest - Proximal Wound Open Wounding Event: Pressure Injury Status: Date Acquired: 12/12/2017 Comorbid Cataracts, Glaucoma, Arrhythmia, Weeks Of Treatment: 1 History: Hypertension, Osteoarthritis, Dementia Clustered Wound: No Photos Photo Uploaded By: Harold Barban on 02/21/2018 16:19:57 Wound Measurements Length: (cm) 0.5 Width: (cm) 0.5 Depth: (cm) 0.1 Area: (cm) 0.196 Volume: (cm) 0.02 % Reduction in Area: 74.8% % Reduction in Volume: 74.4% Epithelialization: None Tunneling: No Undermining: No Wound Description Classification: Category/Stage III Foul Wound Margin: Flat and Intact Sloug Exudate Amount: Medium Exudate Type: Serous Exudate Color: amber Odor After Cleansing:  No h/Fibrino Yes Wound Bed Granulation Amount:  Large (67-100%) Exposed Structure Granulation Quality: Red Fascia Exposed: No Necrotic Amount: Small (1-33%) Fat Layer (Subcutaneous Tissue) Exposed: Yes Necrotic Quality: Adherent Slough Tendon Exposed: No Muscle Exposed: No Joint Exposed: No Bone Exposed: No Periwound Skin Texture Glenn Garcia, Glenn V. ([id number]) Texture Color No Abnormalities Noted: No No Abnormalities Noted: No Callus: No Atrophie Blanche: No Crepitus: No Cyanosis: No Excoriation: No Ecchymosis: Yes Induration: No Erythema: No Rash: No Hemosiderin Staining: No Scarring: No Mottled: No Pallor: No Moisture Rubor: No No Abnormalities Noted: No Dry / Scaly: No Temperature / Pain Maceration: No Temperature: No Abnormality Wound Preparation Ulcer Cleansing: Rinsed/Irrigated with Saline Topical Anesthetic Applied: Other: lidocaine 4%, Treatment Notes Wound #1 (Left, Proximal Chest) Notes prisma, foam and telfa island to chest wound; silvercel, foam and tape to chin Electronic Signature(s) Signed: 02/27/2018 9:36:27 AM By: Harold Barban Entered By: Harold Barban on 02/21/2018 15:18:19 Glenn Garcia, Glenn Garcia ([id number]) -------------------------------------------------------------------------------- Wound Assessment Details Patient Name: Glenn Garcia Date of Service: 02/21/2018 3:00 PM Medical Record Number: [id number] Patient Account Number: [id number] Date of Birth/Sex: 1935/05/19 (83 y.o. M) Treating RN: Montey Hora Primary Care Rayna Brenner: Fulton Reek Other Clinician: Referring Rollyn Scialdone: Fulton Reek Treating Keontre Defino/Extender: Melburn Hake, HOYT Weeks in Treatment: 1 Wound Status Wound Number: 2 Primary Pressure Ulcer Etiology: Wound Location: Chin Wound Open Wounding Event: Gradually Appeared Status: Date Acquired: 02/21/2018 Comorbid Cataracts, Glaucoma, Arrhythmia, Weeks Of Treatment: 0 History: Hypertension, Osteoarthritis, Dementia Clustered Wound: No Photos Photo  Uploaded By: Montey Hora on 02/21/2018 16:35:14 Wound Measurements Length: (cm) 0.8 Width: (cm) 0.8 Depth: (cm) 0.8 Area: (cm) 0.503 Volume: (cm) 0.402 % Reduction in Area: 0% % Reduction in Volume: 0% Epithelialization: None Tunneling: No Undermining: Yes Starting Position (o'clock): 1 Ending Position (o'clock): 1 Maximum Distance: (cm) 0.9 Wound Description Classification: Category/Stage IV Foul O Wound Margin: Flat and Intact Slough Exudate Amount: Medium Exudate Type: Serous Exudate Color: amber dor After Cleansing: No /Fibrino Yes Wound Bed Granulation Amount: Small (1-33%) Exposed Structure Granulation Quality: Pale Fascia Exposed: No Necrotic Amount: Large (67-100%) Fat Layer (Subcutaneous Tissue) Exposed: Yes Necrotic Quality: Adherent Slough Tendon Exposed: No Muscle Exposed: No Glenn Garcia, Glenn Garcia ([id number]) Joint Exposed: No Bone Exposed: Yes Periwound Skin Texture Texture Color No Abnormalities Noted: No No Abnormalities Noted: No Callus: No Atrophie Blanche: No Crepitus: No Cyanosis: No Excoriation: No Ecchymosis: No Induration: No Erythema: No Rash: No Hemosiderin Staining: No Scarring: No Mottled: No Pallor: No Moisture Rubor: No No Abnormalities Noted: No Dry / Scaly: No Temperature / Pain Maceration: No Temperature: No Abnormality Tenderness on Palpation: Yes Wound Preparation Ulcer Cleansing: Rinsed/Irrigated with Saline Topical Anesthetic Applied: None Treatment Notes Wound #2 (Chin) Notes prisma, foam and telfa island to chest wound; silvercel, foam and tape to chin Electronic Signature(s) Signed: 02/21/2018 4:52:44 PM By: Montey Hora Entered By: Montey Hora on 02/21/2018 15:51:28 Glenn Garcia ([id number]) -------------------------------------------------------------------------------- Vitals Details Patient Name: Glenn Garcia Date of Service: 02/21/2018 3:00 PM Medical Record Number:  [id number] Patient Account Number: [id number] Date of Birth/Sex: 1935/11/17 (83 y.o. M) Treating RN: Harold Barban Primary Care Ilyaas Musto: Fulton Reek Other Clinician: Referring Kemond Amorin: Fulton Reek Treating Haset [place]/Extender: Melburn Hake, HOYT Weeks in Treatment: 1 Vital Signs Time Taken: 15:11 Pulse (bpm): 86 Height (in): 62 Respiratory Rate (breaths/min): 18 Weight (lbs): 112 Blood Pressure (mmHg): 112/81 Body Mass Index (BMI): 20.5 Reference Range: 80 - 120 mg / dl Airway Notes Unable to get temperature, patient agitated Electronic Signature(s) Signed: 02/27/2018 9:36:27 AM By:  Harold Barban Entered By: Harold Barban on 02/21/2018 15:12:27

## 2018-02-28 ENCOUNTER — Ambulatory Visit: Payer: Medicare HMO | Admitting: Physician Assistant

## 2018-03-02 ENCOUNTER — Ambulatory Visit: Payer: Medicare HMO | Admitting: Physician Assistant

## 2018-03-07 ENCOUNTER — Ambulatory Visit: Payer: Medicare HMO | Admitting: Physician Assistant

## 2018-03-14 ENCOUNTER — Ambulatory Visit: Payer: Medicare HMO | Admitting: Physician Assistant

## 2018-03-16 ENCOUNTER — Encounter: Payer: Medicare HMO | Attending: Physician Assistant | Admitting: Physician Assistant

## 2018-03-16 DIAGNOSIS — L89893 Pressure ulcer of other site, stage 3: Secondary | ICD-10-CM | POA: Diagnosis present

## 2018-03-16 DIAGNOSIS — I482 Chronic atrial fibrillation, unspecified: Secondary | ICD-10-CM | POA: Diagnosis not present

## 2018-03-16 DIAGNOSIS — R2681 Unsteadiness on feet: Secondary | ICD-10-CM | POA: Insufficient documentation

## 2018-03-16 DIAGNOSIS — S12100S Unspecified displaced fracture of second cervical vertebra, sequela: Secondary | ICD-10-CM | POA: Diagnosis not present

## 2018-03-16 DIAGNOSIS — K219 Gastro-esophageal reflux disease without esophagitis: Secondary | ICD-10-CM | POA: Diagnosis not present

## 2018-03-16 DIAGNOSIS — L89612 Pressure ulcer of right heel, stage 2: Secondary | ICD-10-CM | POA: Insufficient documentation

## 2018-03-16 DIAGNOSIS — I10 Essential (primary) hypertension: Secondary | ICD-10-CM | POA: Diagnosis not present

## 2018-03-16 DIAGNOSIS — F0150 Vascular dementia without behavioral disturbance: Secondary | ICD-10-CM | POA: Diagnosis not present

## 2018-03-19 NOTE — Progress Notes (Signed)
DELMORE, SEAR ([id number]) Visit Report for 03/16/2018 Arrival Information Details Patient Name: Glenn, Garcia. Date of Service: 03/16/2018 10:30 AM Medical Record Number: [id number] Patient Account Number: [id number] Date of Birth/Sex: Dec 12, 1935 (83 y.o. M) Treating RN: Army Melia Primary Care Naya Ilagan: Fulton Reek Other Clinician: Referring Draeden Kellman: Fulton Reek Treating Nisa Decaire/Extender: Melburn Hake, HOYT Weeks in Treatment: 4 Visit Information History Since Last Visit Added or deleted any medications: Yes Patient Arrived: Ambulatory Any new allergies or adverse reactions: No Arrival Time: 11:07 Signs or symptoms of abuse/neglect since last visito No Accompanied By: wife Hospitalized since last visit: No Transfer Assistance: None Has Dressing in Place as Prescribed: Yes Patient Identification Verified: Yes Pain Present Now: No Secondary Verification Process Yes Completed: Patient Has Alerts: Yes Patient Alerts: Patient on Blood Thinner Xarelto Electronic Signature(s) Signed: 03/16/2018 3:04:07 PM By: Lorine Bears RCP, RRT, CHT Entered By: Lorine Bears on 03/16/2018 11:15:04 Glenn Garcia ([id number]) -------------------------------------------------------------------------------- Clinic Level of Care Assessment Details Patient Name: Glenn Garcia Date of Service: 03/16/2018 10:30 AM Medical Record Number: [id number] Patient Account Number: [id number] Date of Birth/Sex: 1935/01/19 (83 y.o. M) Treating RN: Army Melia Primary Care Mihika Surrette: Fulton Reek Other Clinician: Referring Rollan Roger: Fulton Reek Treating Madicyn Mesina/Extender: Melburn Hake, HOYT Weeks in Treatment: 4 Clinic Level of Care Assessment Items TOOL 4 Quantity Score []  - Use when only an EandM is performed on FOLLOW-UP visit 0 ASSESSMENTS - Nursing Assessment / Reassessment []  - Reassessment of Co-morbidities (includes updates in patient status) 0 []   - 0 Reassessment of Adherence to Treatment Plan ASSESSMENTS - Wound and Skin Assessment / Reassessment []  - Simple Wound Assessment / Reassessment - one wound 0 X- 2 5 Complex Wound Assessment / Reassessment - multiple wounds []  - 0 Dermatologic / Skin Assessment (not related to wound area) ASSESSMENTS - Focused Assessment []  - Circumferential Edema Measurements - multi extremities 0 []  - 0 Nutritional Assessment / Counseling / Intervention []  - 0 Lower Extremity Assessment (monofilament, tuning fork, pulses) []  - 0 Peripheral Arterial Disease Assessment (using hand held doppler) ASSESSMENTS - Ostomy and/or Continence Assessment and Care []  - Incontinence Assessment and Management 0 []  - 0 Ostomy Care Assessment and Management (repouching, etc.) PROCESS - Coordination of Care X - Simple Patient / Family Education for ongoing care 1 15 []  - 0 Complex (extensive) Patient / Family Education for ongoing care []  - 0 Staff obtains Programmer, systems, Records, Test Results / Process Orders []  - 0 Staff telephones HHA, Nursing Homes / Clarify orders / etc []  - 0 Routine Transfer to another Facility (non-emergent condition) []  - 0 Routine Hospital Admission (non-emergent condition) []  - 0 New Admissions / Biomedical engineer / Ordering NPWT, Apligraf, etc. []  - 0 Emergency Hospital Admission (emergent condition) X- 1 10 Simple Discharge Coordination Glenn, Garcia ([id number]) []  - 0 Complex (extensive) Discharge Coordination PROCESS - Special Needs []  - Pediatric / Minor Patient Management 0 []  - 0 Isolation Patient Management []  - 0 Hearing / Language / Visual special needs []  - 0 Assessment of Community assistance (transportation, D/C planning, etc.) []  - 0 Additional assistance / Altered mentation []  - 0 Support Surface(s) Assessment (bed, cushion, seat, etc.) INTERVENTIONS - Wound Cleansing / Measurement []  - Simple Wound Cleansing - one wound 0 X- 2 5 Complex Wound  Cleansing - multiple wounds []  - 0 Wound Imaging (photographs - any number of wounds) []  - 0 Wound Tracing (instead of photographs) []  - 0 Simple Wound Measurement - one wound X-  2 5 Complex Wound Measurement - multiple wounds INTERVENTIONS - Wound Dressings []  - Small Wound Dressing one or multiple wounds 0 X- 2 15 Medium Wound Dressing one or multiple wounds []  - 0 Large Wound Dressing one or multiple wounds []  - 0 Application of Medications - topical []  - 0 Application of Medications - injection INTERVENTIONS - Miscellaneous []  - External ear exam 0 []  - 0 Specimen Collection (cultures, biopsies, blood, body fluids, etc.) []  - 0 Specimen(s) / Culture(s) sent or taken to Lab for analysis []  - 0 Patient Transfer (multiple staff / Civil Service fast streamer / Similar devices) []  - 0 Simple Staple / Suture removal (25 or less) []  - 0 Complex Staple / Suture removal (26 or more) []  - 0 Hypo / Hyperglycemic Management (close monitor of Blood Glucose) []  - 0 Ankle / Brachial Index (ABI) - do not check if billed separately X- 1 5 Vital Signs Glenn, Marshun V. ([id number]) Has the patient been seen at the hospital within the last three years: Yes Total Score: 90 Level Of Care: New/Established - Level 3 Electronic Signature(s) Signed: 03/17/2018 3:17:07 PM By: Army Melia Entered By: Army Melia on 03/16/2018 12:28:10 Glenn Garcia ([id number]) -------------------------------------------------------------------------------- Lower Extremity Assessment Details Patient Name: Glenn Garcia Date of Service: 03/16/2018 10:30 AM Medical Record Number: [id number] Patient Account Number: [id number] Date of Birth/Sex: [birth year] (83 y.o. M) Treating RN: Montey Hora Primary Care Purnell Daigle: Fulton Reek Other Clinician: Referring Dominica Kent: Fulton Reek Treating Cornellius Kropp/Extender: Melburn Hake, HOYT Weeks in Treatment: 4 Electronic Signature(s) Signed: 03/16/2018 4:54:37 PM By: Montey Hora Entered By: Montey Hora on 03/16/2018 11:35:19 Glenn Garcia, Glenn Garcia ([id number]) -------------------------------------------------------------------------------- Multi Wound Chart Details Patient Name: Glenn Garcia Date of Service: 03/16/2018 10:30 AM Medical Record Number: [id number] Patient Account Number: [id number] Date of Birth/Sex: 1935-06-20 (83 y.o. M) Treating RN: Army Melia Primary Care Khiry Pasquariello: Fulton Reek Other Clinician: Referring Eleanna Theilen: Fulton Reek Treating Lucette Kratz/Extender: Melburn Hake, HOYT Weeks in Treatment: 4 Vital Signs Height(in): 62 Pulse(bpm): 38 Weight(lbs): 112 Blood Pressure(mmHg): 96/71 Body Mass Index(BMI): 20 Temperature(F): 97.5 Respiratory Rate 16 (breaths/min): Photos: Wound Location: Left, Proximal Chest Chin Right Calcaneus Wounding Event: Pressure Injury Gradually Appeared Pressure Injury Primary Etiology: Pressure Ulcer Pressure Ulcer Pressure Ulcer Comorbid History: N/A Cataracts, Glaucoma, Cataracts, Glaucoma, Arrhythmia, Hypertension, Arrhythmia, Hypertension, Osteoarthritis, Dementia Osteoarthritis, Dementia Date Acquired: 12/12/2017 02/21/2018 03/13/2018 Weeks of Treatment: 4 3 0 Wound Status: Open Open Open Measurements L x W x D 0x0x0 0.5x0.5x0.3 0.4x0.4x0.1 (cm) Area (cm) : 0 0.196 0.126 Volume (cm) : 0 0.059 0.013 % Reduction in Area: 100.00% 61.00% N/A % Reduction in Volume: 100.00% 85.30% N/A Classification: Category/Stage III Category/Stage IV Category/Stage II Exudate Amount: N/A Medium Small Exudate Type: N/A Purulent Serous Exudate Color: N/A yellow, brown, green amber Wound Margin: N/A Flat and Intact Flat and Intact Granulation Amount: N/A None Present (0%) Large (67-100%) Granulation Quality: N/A N/A Pink Necrotic Amount: N/A None Present (0%) None Present (0%) Epithelialization: N/A None None Periwound Skin Texture: No Abnormalities Noted Excoriation: No Excoriation: No Induration:  No Induration: No Callus: No Callus: No Crepitus: No Crepitus: No Glenn Garcia, Glenn V. ([id number]) Rash: No Rash: No Scarring: No Scarring: No Periwound Skin Moisture: No Abnormalities Noted Maceration: No Maceration: No Dry/Scaly: No Dry/Scaly: No Periwound Skin Color: No Abnormalities Noted Atrophie Blanche: No Atrophie Blanche: No Cyanosis: No Cyanosis: No Ecchymosis: No Ecchymosis: No Erythema: No Erythema: No Hemosiderin Staining: No Hemosiderin Staining: No Mottled: No Mottled: No Pallor: No Pallor: No Rubor: No Rubor: No  Temperature: N/A No Abnormality No Abnormality Tenderness on Palpation: No Yes No Wound Preparation: N/A Ulcer Cleansing: Ulcer Cleansing: Rinsed/Irrigated with Saline Rinsed/Irrigated with Saline Topical Anesthetic Applied: Topical Anesthetic Applied: None Other: lidocaine 4% Treatment Notes Electronic Signature(s) Signed: 03/17/2018 3:17:07 PM By: Army Melia Entered By: Army Melia on 03/16/2018 12:13:40 Glenn Garcia ([id number]) -------------------------------------------------------------------------------- [place] Details Patient Name: Glenn Garcia Date of Service: 03/16/2018 10:30 AM Medical Record Number: [id number] Patient Account Number: [id number] Date of Birth/Sex: [birth year] (83 y.o. M) Treating RN: Army Melia Primary Care Kurtis Anastasia: Fulton Reek Other Clinician: Referring Azlin Zilberman: Fulton Reek Treating [place] Basham/Extender: Melburn Hake, HOYT Weeks in Treatment: 4 Active Inactive Wound/Skin Impairment Nursing Diagnoses: Impaired tissue integrity Knowledge deficit related to ulceration/compromised skin integrity Goals: Ulcer/skin breakdown will have a volume reduction of 30% by week 4 Date Initiated: 02/13/2018 Target Resolution Date: 03/14/2018 Goal Status: Active Interventions: Assess patient/caregiver ability to obtain necessary supplies Assess patient/caregiver ability to perform  ulcer/skin care regimen upon admission and as needed Assess ulceration(s) every visit Provide education on ulcer and skin care Notes: Electronic Signature(s) Signed: 03/17/2018 3:17:07 PM By: Army Melia Entered By: Army Melia on 03/16/2018 12:13:30 Glenn Garcia ([id number]) -------------------------------------------------------------------------------- Pain Assessment Details Patient Name: Glenn Garcia Date of Service: 03/16/2018 10:30 AM Medical Record Number: [id number] Patient Account Number: [id number] Date of Birth/Sex: 1935-12-02 (83 y.o. M) Treating RN: Army Melia Primary Care Rogerick Baldwin: Fulton Reek Other Clinician: Referring Lennox Dolberry: Fulton Reek Treating Cleaster Shiffer/Extender: Melburn Hake, HOYT Weeks in Treatment: 4 Active Problems Location of Pain Severity and Description of Pain Patient Has Paino No Site Locations Pain Management and Medication Current Pain Management: Electronic Signature(s) Signed: 03/16/2018 3:04:07 PM By: Lorine Bears RCP, RRT, CHT Signed: 03/17/2018 3:17:07 PM By: Army Melia Entered By: Lorine Bears on 03/16/2018 11:15:11 Glenn Garcia ([id number]) -------------------------------------------------------------------------------- Patient/Caregiver Education Details Patient Name: Glenn Garcia Date of Service: 03/16/2018 10:30 AM Medical Record Number: [id number] Patient Account Number: [id number] Date of Birth/Gender: 1935-07-12 (83 y.o. M) Treating RN: Army Melia Primary Care Physician: Fulton Reek Other Clinician: Referring Physician: Fulton Reek Treating Physician/Extender: Sharalyn Ink in Treatment: 4 Education Assessment Education Provided To: Caregiver Education Topics Provided Wound/Skin Impairment: Handouts: Caring for Your Ulcer Methods: Demonstration, Explain/Verbal Responses: State content correctly Electronic Signature(s) Signed: 03/17/2018 3:17:07 PM By:  Army Melia Entered By: Army Melia on 03/16/2018 12:28:26 Glenn Garcia ([id number]) -------------------------------------------------------------------------------- Wound Assessment Details Patient Name: Glenn Garcia Date of Service: 03/16/2018 10:30 AM Medical Record Number: [id number] Patient Account Number: [id number] Date of Birth/Sex: [birth year] (83 y.o. M) Treating RN: Montey Hora Primary Care Jaikob Borgwardt: Fulton Reek Other Clinician: Referring Franco Duley: Fulton Reek Treating Jalen Oberry/Extender: Melburn Hake, HOYT Weeks in Treatment: 4 Wound Status Wound Number: 1 Primary Etiology: Pressure Ulcer Wound Location: Left, Proximal Chest Wound Status: Open Wounding Event: Pressure Injury Date Acquired: 12/12/2017 Weeks Of Treatment: 4 Clustered Wound: No Photos Photo Uploaded By: Montey Hora on 03/16/2018 11:57:03 Wound Measurements Length: (cm) 0 % Width: (cm) 0 % Depth: (cm) 0 Area: (cm) 0 Volume: (cm) 0 Reduction in Area: 100% Reduction in Volume: 100% Wound Description Classification: Category/Stage III Periwound Skin Texture Texture Color No Abnormalities Noted: No No Abnormalities Noted: No Moisture No Abnormalities Noted: No Electronic Signature(s) Signed: 03/16/2018 4:54:37 PM By: Montey Hora Entered By: Montey Hora on 03/16/2018 11:49:17 Glenn Garcia, Glenn Garcia ([id number]) -------------------------------------------------------------------------------- Wound Assessment Details Patient Name: Glenn Garcia Date of Service: 03/16/2018 10:30 AM Medical Record Number: [id number] Patient Account Number: [id number] Date  of Birth/Sex: December 13, 1935 (83 y.o. M) Treating RN: Montey Hora Primary Care Grettel Rames: Fulton Reek Other Clinician: Referring Jaidalyn Schillo: Fulton Reek Treating Oney Folz/Extender: Melburn Hake, HOYT Weeks in Treatment: 4 Wound Status Wound Number: 2 Primary Pressure Ulcer Etiology: Wound Location: Chin Wound  Open Wounding Event: Gradually Appeared Status: Date Acquired: 02/21/2018 Comorbid Cataracts, Glaucoma, Arrhythmia, Weeks Of Treatment: 3 History: Hypertension, Osteoarthritis, Dementia Clustered Wound: No Photos Photo Uploaded By: Montey Hora on 03/16/2018 11:57:04 Wound Measurements Length: (cm) 0.5 Width: (cm) 0.5 Depth: (cm) 0.3 Area: (cm) 0.196 Volume: (cm) 0.059 % Reduction in Area: 61% % Reduction in Volume: 85.3% Epithelialization: None Tunneling: No Undermining: No Wound Description Classification: Category/Stage IV Foul O Wound Margin: Flat and Intact Slough Exudate Amount: Medium Exudate Type: Purulent Exudate Color: yellow, brown, green dor After Cleansing: No /Fibrino Yes Wound Bed Granulation Amount: None Present (0%) Exposed Structure Necrotic Amount: None Present (0%) Fascia Exposed: No Fat Layer (Subcutaneous Tissue) Exposed: Yes Tendon Exposed: No Muscle Exposed: No Joint Exposed: No Bone Exposed: Yes Periwound Skin Texture Glenn Garcia, Glenn V. ([id number]) Texture Color No Abnormalities Noted: No No Abnormalities Noted: No Callus: No Atrophie Blanche: No Crepitus: No Cyanosis: No Excoriation: No Ecchymosis: No Induration: No Erythema: No Rash: No Hemosiderin Staining: No Scarring: No Mottled: No Pallor: No Moisture Rubor: No No Abnormalities Noted: No Dry / Scaly: No Temperature / Pain Maceration: No Temperature: No Abnormality Tenderness on Palpation: Yes Wound Preparation Ulcer Cleansing: Rinsed/Irrigated with Saline Topical Anesthetic Applied: None Electronic Signature(s) Signed: 03/16/2018 4:54:37 PM By: Montey Hora Entered By: Montey Hora on 03/16/2018 11:49:11 Glenn Garcia, Glenn Garcia ([id number]) -------------------------------------------------------------------------------- Wound Assessment Details Patient Name: Glenn Garcia Date of Service: 03/16/2018 10:30 AM Medical Record Number: [id number] Patient Account  Number: [id number] Date of Birth/Sex: 1935/02/22 (83 y.o. M) Treating RN: Montey Hora Primary Care Meilah Delrosario: Fulton Reek Other Clinician: Referring Bob Eastwood: Fulton Reek Treating Ryshawn Sanzone/Extender: Melburn Hake, HOYT Weeks in Treatment: 4 Wound Status Wound Number: 3 Primary Pressure Ulcer Etiology: Wound Location: Right Calcaneus Wound Open Wounding Event: Pressure Injury Status: Date Acquired: 03/13/2018 Comorbid Cataracts, Glaucoma, Arrhythmia, Weeks Of Treatment: 0 History: Hypertension, Osteoarthritis, Dementia Clustered Wound: No Photos Photo Uploaded By: Montey Hora on 03/16/2018 11:57:30 Wound Measurements Length: (cm) 0.4 Width: (cm) 0.4 Depth: (cm) 0.1 Area: (cm) 0.126 Volume: (cm) 0.013 % Reduction in Area: % Reduction in Volume: Epithelialization: None Tunneling: No Undermining: No Wound Description Classification: Category/Stage II Foul O Wound Margin: Flat and Intact Slough Exudate Amount: Small Exudate Type: Serous Exudate Color: amber dor After Cleansing: No /Fibrino No Wound Bed Granulation Amount: Large (67-100%) Exposed Structure Granulation Quality: Pink Fascia Exposed: No Necrotic Amount: None Present (0%) Fat Layer (Subcutaneous Tissue) Exposed: Yes Tendon Exposed: No Muscle Exposed: No Joint Exposed: No Bone Exposed: No Periwound Skin Texture Glenn Garcia, Glenn V. ([id number]) Texture Color No Abnormalities Noted: No No Abnormalities Noted: No Callus: No Atrophie Blanche: No Crepitus: No Cyanosis: No Excoriation: No Ecchymosis: No Induration: No Erythema: No Rash: No Hemosiderin Staining: No Scarring: No Mottled: No Pallor: No Moisture Rubor: No No Abnormalities Noted: No Dry / Scaly: No Temperature / Pain Maceration: No Temperature: No Abnormality Wound Preparation Ulcer Cleansing: Rinsed/Irrigated with Saline Topical Anesthetic Applied: Other: lidocaine 4%, Electronic Signature(s) Signed: 03/16/2018 4:54:37  PM By: Montey Hora Entered By: Montey Hora on 03/16/2018 11:43:03 Glenn Garcia, Glenn Garcia ([id number]) -------------------------------------------------------------------------------- Vitals Details Patient Name: Glenn Garcia Date of Service: 03/16/2018 10:30 AM Medical Record Number: [id number] Patient Account Number: [id number] Date of Birth/Sex: 12/01/1935 (83 y.o. M) Treating  RN: Army Melia Primary Care Dravin Lance: Fulton Reek Other Clinician: Referring Cydnie Deason: Fulton Reek Treating Nkenge Sonntag/Extender: Melburn Hake, HOYT Weeks in Treatment: 4 Vital Signs Time Taken: 11:15 Temperature (F): 97.5 Height (in): 62 Pulse (bpm): 72 Weight (lbs): 112 Respiratory Rate (breaths/min): 16 Body Mass Index (BMI): 20.5 Blood Pressure (mmHg): 96/71 Reference Range: 80 - 120 mg / dl Electronic Signature(s) Signed: 03/16/2018 3:04:07 PM By: Lorine Bears RCP, RRT, CHT Entered By: Lorine Bears on 03/16/2018 11:17:21

## 2018-03-20 NOTE — Progress Notes (Addendum)
REUBIN, BUSHNELL ([id number]) Visit Report for 03/16/2018 Chief Complaint Document Details Patient Name: Glenn Garcia, Glenn Garcia. Date of Service: 03/16/2018 10:30 AM Medical Record Number: [id number] Patient Account Number: [id number] Date of Birth/Sex: 1935-02-04 (83 y.o. M) Treating RN: Army Melia Primary Care Provider: Fulton Reek Other Clinician: Referring Provider: Fulton Reek Treating Provider/Extender: Melburn Hake, Belvie Iribe Weeks in Treatment: 4 Information Obtained from: Patient Chief Complaint Chest wall ulcer Electronic Signature(s) Signed: 03/19/2018 11:30:14 PM By: Worthy Keeler PA-C Entered By: Worthy Keeler on 03/16/2018 12:07:28 Glenn Garcia ([id number]) -------------------------------------------------------------------------------- HPI Details Patient Name: Glenn Garcia Date of Service: 03/16/2018 10:30 AM Medical Record Number: [id number] Patient Account Number: [id number] Date of Birth/Sex: [birth year] (83 y.o. M) Treating RN: Army Melia Primary Care Provider: Fulton Reek Other Clinician: Referring Provider: Fulton Reek Treating Provider/Extender: Melburn Hake, Elodia Haviland Weeks in Treatment: 4 History of Present Illness HPI Description: 02/13/18 patient presents today for initial evaluation our clinic concerning issues that he has been having with a pressure area over the collarbone on the left secondary to having to wear a cervical collar since July 2019 when he sustained a C2 cervical fracture. His son and wife have been caring for him and have done an excellent job up to this point. With that being said he does prefer to lay on his left side and I think this may be making him more prone to breakdown and pressure at the caller region on the left. Fortunately there is no signs of infection this time and there is no bone exposed although this is a very precarious location just as in fact there's not much skin overlying the area of concern. With that  being said I do believe that there is no evidence of infection currently which is good news. The biggest concern is gonna be that he will continue to require offloading since he's gonna continue to we need the cervical collar in order to prevent further breakdown. Patient does have a history of fairly significant dimension along with hypertension and chronic issue fibrillation. Other than that obviously just the cervical fracture at C2 which he has been in the cervical collar for since July 2019. This injury occurred as a result of the fall. 02/21/18 on evaluation today patient appears to be much more agitated. Apparently this is something that he typically exhibits as the day progresses and unfortunately he's Artie had an appointment with cardiology today as well which only exacerbated the agitation. Nonetheless the wound that we were monitoring last visit over the clavicle region is actually significantly improved and is doing excellent in my pinion. Unfortunately the area underneath his chin where I was told they were just putting a little bit of anabiotic cream last week appears to actually be open with a significant although small hole that extends down to the jawbone unfortunately. This is where pressure has been exuded on the jawbone areas results of the cervical collar. Nonetheless we gonna definitely need to address this today. 03/16/18 on evaluation today patient actually appears to be doing excellent in regard to his chest ulcer. The jaw ulcer does not appear to be doing any worse he still is having some drinks from this area is still open. Nonetheless I do think that there may have been a little bit of confusion about what to do dressing wise for this region I really want him to be using the alginate dressing not the collagen at this site. Nonetheless I think that that's something we can straighten out today  I just believe that this needs to be controlled more as far as the drainage is  concerned versus what we're seeing at this point. Other than that he seems to be doing fairly well which is good news. He does have a new ulcer on his heel but this again appears to be very superficial but amusing collagen on this which I think is absolutely appropriate and that has helped this to do extremely well. Electronic Signature(s) Signed: 03/19/2018 11:30:14 PM By: Worthy Keeler PA-C Entered By: Worthy Keeler on 03/18/2018 07:04:59 Glenn Garcia ([id number]) -------------------------------------------------------------------------------- Physical Exam Details Patient Name: Glenn Garcia Date of Service: 03/16/2018 10:30 AM Medical Record Number: [id number] Patient Account Number: [id number] Date of Birth/Sex: 02/13/1935 (83 y.o. M) Treating RN: Army Melia Primary Care Provider: Fulton Reek Other Clinician: Referring Provider: Fulton Reek Treating Provider/Extender: Melburn Hake, Tashala Cumbo Weeks in Treatment: 4 Constitutional Well-nourished and well-hydrated in no acute distress. Respiratory normal breathing without difficulty. clear to auscultation bilaterally. Cardiovascular regular rate and rhythm with normal S1, S2. Musculoskeletal unsteady while walking. Psychiatric Patient is not able to cooperate in decision making regarding care. Patient has dementia. patient is agitated. Notes Upon inspection today patient's wound again on his chest over the clavicle appears to be completely healed which is excellent news. In regard to left jawline there still is a wound with some undermining in this region as well unfortunately. There is no signs of infection at the site which is good news. In regard to the new wound on the heel this appears to be very small and I see no evidence of anything significant at this point that is gonna require debridement or likely much more care than just taking care of it as the family has been already. Electronic Signature(s) Signed:  03/19/2018 11:30:14 PM By: Worthy Keeler PA-C Entered By: Worthy Keeler on 03/18/2018 07:06:13 Glenn Garcia ([id number]) -------------------------------------------------------------------------------- Physician Orders Details Patient Name: Glenn Garcia Date of Service: 03/16/2018 10:30 AM Medical Record Number: [id number] Patient Account Number: [id number] Date of Birth/Sex: [birth year] (83 y.o. M) Treating RN: Army Melia Primary Care Provider: Fulton Reek Other Clinician: Referring Provider: Fulton Reek Treating Provider/Extender: Melburn Hake, Gwendolyne Welford Weeks in Treatment: 4 Verbal / Phone Orders: No Diagnosis Coding ICD-10 Coding Code Description F01.50 Vascular dementia without behavioral disturbance S12.100A Unspecified displaced fracture of second cervical vertebra, initial encounter for closed fracture L89.893 Pressure ulcer of other site, stage 3 I10 Essential (primary) hypertension I48.20 Chronic atrial fibrillation, unspecified Primary Wound Dressing Wound #2 Chin o Silver Alginate Wound #3 Right Calcaneus o Silver Collagen - moisten with saline Secondary Dressing Wound #2 Chin o Foam Wound #3 Right Calcaneus o Boardered Foam Dressing Dressing Change Frequency Wound #2 Chin o Change dressing every other day. Wound #3 Right Calcaneus o Change dressing every other day. Follow-up Appointments Wound #2 Geryl Councilman o Return Appointment in 2 weeks. Wound #3 Right Calcaneus o Return Appointment in 2 weeks. Electronic Signature(s) Signed: 04/21/2018 2:25:46 PM By: Worthy Keeler PA-C Previous Signature: 03/17/2018 3:17:07 PM Version By: Army Melia Previous Signature: 03/19/2018 11:30:14 PM Version By: Nuala Alpha, Warden VMarland Kitchen ([id number]) Entered By: Worthy Keeler on 04/21/2018 14:25:07 Glenn Garcia ([id number]) -------------------------------------------------------------------------------- Problem List Details Patient  Name: Glenn Garcia Date of Service: 03/16/2018 10:30 AM Medical Record Number: [id number] Patient Account Number: [id number] Date of Birth/Sex: 04-27-1935 (83 y.o. M) Treating RN: Army Melia Primary Care Provider: Fulton Reek Other Clinician: Referring Provider: Fulton Reek Treating  Provider/Extender: Melburn Hake, Khila Papp Weeks in Treatment: 4 Active Problems ICD-10 Evaluated Encounter Code Description Active Date Today Diagnosis F01.50 Vascular dementia without behavioral disturbance 02/13/2018 No Yes S12.100A Unspecified displaced fracture of second cervical vertebra, 02/13/2018 No Yes initial encounter for closed fracture L89.893 Pressure ulcer of other site, stage 3 02/13/2018 No Yes L89.612 Pressure ulcer of right heel, stage 2 03/18/2018 No Yes I10 Essential (primary) hypertension 02/13/2018 No Yes I48.20 Chronic atrial fibrillation, unspecified 02/13/2018 No Yes Inactive Problems Resolved Problems Electronic Signature(s) Signed: 03/19/2018 11:30:14 PM By: Worthy Keeler PA-C Entered By: Worthy Keeler on 03/18/2018 07:07:46 Cuadra, Nilda Simmer ([id number]) -------------------------------------------------------------------------------- Progress Note Details Patient Name: Glenn Garcia Date of Service: 03/16/2018 10:30 AM Medical Record Number: [id number] Patient Account Number: [id number] Date of Birth/Sex: 04/08/1935 (83 y.o. M) Treating RN: Army Melia Primary Care Provider: Fulton Reek Other Clinician: Referring Provider: Fulton Reek Treating Provider/Extender: Melburn Hake, Dulcie Gammon Weeks in Treatment: 4 Subjective Chief Complaint Information obtained from Patient Chest wall ulcer History of Present Illness (HPI) 02/13/18 patient presents today for initial evaluation our clinic concerning issues that he has been having with a pressure area over the collarbone on the left secondary to having to wear a cervical collar since July 2019 when he sustained a C2  cervical fracture. His son and wife have been caring for him and have done an excellent job up to this point. With that being said he does prefer to lay on his left side and I think this may be making him more prone to breakdown and pressure at the caller region on the left. Fortunately there is no signs of infection this time and there is no bone exposed although this is a very precarious location just as in fact there's not much skin overlying the area of concern. With that being said I do believe that there is no evidence of infection currently which is good news. The biggest concern is gonna be that he will continue to require offloading since he's gonna continue to we need the cervical collar in order to prevent further breakdown. Patient does have a history of fairly significant dimension along with hypertension and chronic issue fibrillation. Other than that obviously just the cervical fracture at C2 which he has been in the cervical collar for since July 2019. This injury occurred as a result of the fall. 02/21/18 on evaluation today patient appears to be much more agitated. Apparently this is something that he typically exhibits as the day progresses and unfortunately he's Artie had an appointment with cardiology today as well which only exacerbated the agitation. Nonetheless the wound that we were monitoring last visit over the clavicle region is actually significantly improved and is doing excellent in my pinion. Unfortunately the area underneath his chin where I was told they were just putting a little bit of anabiotic cream last week appears to actually be open with a significant although small hole that extends down to the jawbone unfortunately. This is where pressure has been exuded on the jawbone areas results of the cervical collar. Nonetheless we gonna definitely need to address this today. 03/16/18 on evaluation today patient actually appears to be doing excellent in regard to his  chest ulcer. The jaw ulcer does not appear to be doing any worse he still is having some drinks from this area is still open. Nonetheless I do think that there may have been a little bit of confusion about what to do dressing wise for this region I really  want him to be using the alginate dressing not the collagen at this site. Nonetheless I think that that's something we can straighten out today I just believe that this needs to be controlled more as far as the drainage is concerned versus what we're seeing at this point. Other than that he seems to be doing fairly well which is good news. He does have a new ulcer on his heel but this again appears to be very superficial but amusing collagen on this which I think is absolutely appropriate and that has helped this to do extremely well. Patient History Information obtained from Patient. Family History Cancer - Father,Siblings, Diabetes - Siblings, Heart Disease - Siblings, Lung Disease - Siblings, Stroke - Siblings, No family history of Hereditary Spherocytosis, Hypertension, Kidney Disease, Seizures, Thyroid Problems, Tuberculosis. Social History Never smoker, Marital Status - Married, Alcohol Use - Never, Drug Use - No History, Caffeine Use - Never. Medical History Eyes MYERS, TUTTEROW ([id number]) Patient has history of Cataracts - removed in right, Glaucoma Denies history of Optic Neuritis Ear/Nose/Mouth/Throat Denies history of Chronic sinus problems/congestion, Middle ear problems Hematologic/Lymphatic Denies history of Anemia, Hemophilia, Human Immunodeficiency Virus, Lymphedema, Sickle Cell Disease Respiratory Denies history of Aspiration, Asthma, Chronic Obstructive Pulmonary Disease (COPD), Pneumothorax, Sleep Apnea, Tuberculosis Cardiovascular Patient has history of Arrhythmia - a fib, Hypertension Denies history of Angina, Congestive Heart Failure, Coronary Artery Disease, Deep Vein Thrombosis, Hypotension, Myocardial  Infarction, Peripheral Arterial Disease, Peripheral Venous Disease, Phlebitis, Vasculitis Gastrointestinal Denies history of Cirrhosis , Colitis, Crohn s, Hepatitis A, Hepatitis B, Hepatitis C Endocrine Denies history of Type I Diabetes, Type II Diabetes Genitourinary Denies history of End Stage Renal Disease Immunological Denies history of Lupus Erythematosus, Raynaud s, Scleroderma Integumentary (Skin) Denies history of History of Burn, History of pressure wounds Musculoskeletal Patient has history of Osteoarthritis Denies history of Gout, Rheumatoid Arthritis, Osteomyelitis Neurologic Patient has history of Dementia Denies history of Neuropathy, Quadriplegia, Paraplegia, Seizure Disorder Oncologic Denies history of Received Chemotherapy, Received Radiation Medical And Surgical History Notes Gastrointestinal GERD Musculoskeletal fracture of c2 with chronic cervical collar Review of Systems (ROS) Constitutional Symptoms (General Health) Denies complaints or symptoms of Fever, Chills. Respiratory The patient has no complaints or symptoms. Cardiovascular The patient has no complaints or symptoms. Psychiatric The patient has no complaints or symptoms. Objective Constitutional Well-nourished and well-hydrated in no acute distress. TURHAN, CHILL ([id number]) Vitals Time Taken: 11:15 AM, Height: 62 in, Weight: 112 lbs, BMI: 20.5, Temperature: 97.5 F, Pulse: 72 bpm, Respiratory Rate: 16 breaths/min, Blood Pressure: 96/71 mmHg. Respiratory normal breathing without difficulty. clear to auscultation bilaterally. Cardiovascular regular rate and rhythm with normal S1, S2. Musculoskeletal unsteady while walking. Psychiatric Patient is not able to cooperate in decision making regarding care. Patient has dementia. patient is agitated. General Notes: Upon inspection today patient's wound again on his chest over the clavicle appears to be completely healed which is excellent  news. In regard to left jawline there still is a wound with some undermining in this region as well unfortunately. There is no signs of infection at the site which is good news. In regard to the new wound on the heel this appears to be very small and I see no evidence of anything significant at this point that is gonna require debridement or likely much more care than just taking care of it as the family has been already. Integumentary (Hair, Skin) Wound #1 status is Open. Original cause of wound was Pressure Injury. The wound is located  on the Left,Proximal Chest. The wound measures 0cm length x 0cm width x 0cm depth; 0cm^2 area and 0cm^3 volume. Wound #2 status is Open. Original cause of wound was Gradually Appeared. The wound is located on the [place]. The wound measures 0.5cm length x 0.5cm width x 0.3cm depth; 0.196cm^2 area and 0.059cm^3 volume. There is bone and Fat Layer (Subcutaneous Tissue) Exposed exposed. There is no tunneling or undermining noted. There is a medium amount of purulent drainage noted. The wound margin is flat and intact. There is no granulation within the wound bed. There is no necrotic tissue within the wound bed. The periwound skin appearance did not exhibit: Callus, Crepitus, Excoriation, Induration, Rash, Scarring, Dry/Scaly, Maceration, Atrophie Blanche, Cyanosis, Ecchymosis, Hemosiderin Staining, Mottled, Pallor, Rubor, Erythema. Periwound temperature was noted as No Abnormality. The periwound has tenderness on palpation. Wound #3 status is Open. Original cause of wound was Pressure Injury. The wound is located on the Right Calcaneus. The wound measures 0.4cm length x 0.4cm width x 0.1cm depth; 0.126cm^2 area and 0.013cm^3 volume. There is Fat Layer (Subcutaneous Tissue) Exposed exposed. There is no tunneling or undermining noted. There is a small amount of serous drainage noted. The wound margin is flat and intact. There is large (67-100%) pink granulation within the  wound bed. There is no necrotic tissue within the wound bed. The periwound skin appearance did not exhibit: Callus, Crepitus, Excoriation, Induration, Rash, Scarring, Dry/Scaly, Maceration, Atrophie Blanche, Cyanosis, Ecchymosis, Hemosiderin Staining, Mottled, Pallor, Rubor, Erythema. Periwound temperature was noted as No Abnormality. Assessment Active Problems ICD-10 Vascular dementia without behavioral disturbance Unspecified displaced fracture of second cervical vertebra, initial encounter for closed fracture Pressure ulcer of other site, stage 3 Pressure ulcer of right heel, stage 2 Essential (primary) hypertension Chronic atrial fibrillation, unspecified TIMO, HARTWIG ([id number]) Plan Primary Wound Dressing: Wound #2 Chin: Silver Alginate Wound #3 Right Calcaneus: Silver Collagen - moisten with saline Secondary Dressing: Wound #2 Chin: Foam Wound #3 Right Calcaneus: Boardered Foam Dressing Dressing Change Frequency: Wound #2 Chin: Change dressing every other day. Wound #3 Right Calcaneus: Change dressing every other day. Follow-up Appointments: Wound #2 Chin: Return Appointment in 2 weeks. Wound #3 Right Calcaneus: Return Appointment in 2 weeks. My suggestion currently is gonna be that we go ahead and initiate treatment with the above wound care measures. The patient is in agreement with that plan. We will subsequently see were things stand at follow-up. If anything changes or worsens in the meantime patient's family will contact the office and let me know. Please see above for specific wound care orders. We will see patient for re-evaluation in 2 week(s) here in the clinic. If anything worsens or changes patient will contact our office for additional recommendations. Electronic Signature(s) Signed: 04/21/2018 2:25:46 PM By: Worthy Keeler PA-C Previous Signature: 03/19/2018 11:30:14 PM Version By: Worthy Keeler PA-C Entered By: Worthy Keeler on 04/21/2018  14:25:17 Glenn Garcia ([id number]) -------------------------------------------------------------------------------- ROS/PFSH Details Patient Name: Glenn Garcia Date of Service: 03/16/2018 10:30 AM Medical Record Number: [id number] Patient Account Number: [id number] Date of Birth/Sex: [birth year] (83 y.o. M) Treating RN: Army Melia Primary Care Provider: Fulton Reek Other Clinician: Referring Provider: Fulton Reek Treating Provider/Extender: Melburn Hake, Deborah Dondero Weeks in Treatment: 4 Information Obtained From Patient Wound History Do you currently have one or more open woundso Yes How many open wounds do you currently haveo 2 Approximately how long have you had your woundso 6 weeks How have you been treating your wound(s) until nowo mupirocin  Has your wound(s) ever healed and then re-openedo No Have you had any lab work done in the past montho Yes Who ordered the lab work [facility] Have you tested positive for an antibiotic resistant organism (MRSA, VRE)o No Have you tested positive for osteomyelitis (bone infection)o No Have you had any tests for circulation on your legso No Constitutional Symptoms (General Health) Complaints and Symptoms: Negative for: Fever; Chills Eyes Medical History: Positive for: Cataracts - removed in right; Glaucoma Negative for: Optic Neuritis Ear/Nose/Mouth/Throat Medical History: Negative for: Chronic sinus problems/congestion; Middle ear problems Hematologic/Lymphatic Medical History: Negative for: Anemia; Hemophilia; Human Immunodeficiency Virus; Lymphedema; Sickle Cell Disease Respiratory Complaints and Symptoms: No Complaints or Symptoms Medical History: Negative for: Aspiration; Asthma; Chronic Obstructive Pulmonary Disease (COPD); Pneumothorax; Sleep Apnea; Tuberculosis Cardiovascular Complaints and Symptoms: No Complaints or Symptoms CARTER, KASSEL ([id number]) Medical History: Positive for: Arrhythmia - a fib;  Hypertension Negative for: Angina; Congestive Heart Failure; Coronary Artery Disease; Deep Vein Thrombosis; Hypotension; Myocardial Infarction; Peripheral Arterial Disease; Peripheral Venous Disease; Phlebitis; Vasculitis Gastrointestinal Medical History: Negative for: Cirrhosis ; Colitis; Crohnos; Hepatitis A; Hepatitis B; Hepatitis C Past Medical History Notes: GERD Endocrine Medical History: Negative for: Type I Diabetes; Type II Diabetes Genitourinary Medical History: Negative for: End Stage Renal Disease Immunological Medical History: Negative for: Lupus Erythematosus; Raynaudos; Scleroderma Integumentary (Skin) Medical History: Negative for: History of Burn; History of pressure wounds Musculoskeletal Medical History: Positive for: Osteoarthritis Negative for: Gout; Rheumatoid Arthritis; Osteomyelitis Past Medical History Notes: fracture of c2 with chronic cervical collar Neurologic Medical History: Positive for: Dementia Negative for: Neuropathy; Quadriplegia; Paraplegia; Seizure Disorder Oncologic Medical History: Negative for: Received Chemotherapy; Received Radiation Psychiatric Complaints and Symptoms: No Complaints or Symptoms HBO Extended History Items Eyes: Eyes: Cataracts Glaucoma Algeo, KHRISTIAN PHILLIPPI ([id number]) Immunizations Pneumococcal Vaccine: Received Pneumococcal Vaccination: Yes Immunization Notes: up to date Implantable Devices No devices added Family and Social History Cancer: Yes - Father,Siblings; Diabetes: Yes - Siblings; Heart Disease: Yes - Siblings; Hereditary Spherocytosis: No; Hypertension: No; Kidney Disease: No; Lung Disease: Yes - Siblings; Seizures: No; Stroke: Yes - Siblings; Thyroid Problems: No; Tuberculosis: No; Never smoker; Marital Status - Married; Alcohol Use: Never; Drug Use: No History; Caffeine Use: Never; Financial Concerns: No; Food, Clothing or Shelter Needs: No; Support System Lacking: No; Transportation Concerns:  No; Advanced Directives: Yes (Not Provided); Patient does not want information on Advanced Directives; Living Will: Yes (Not Provided); Medical Power of Attorney: Yes - spouse (Not Provided) Physician Affirmation I have reviewed and agree with the above information. Electronic Signature(s) Signed: 03/19/2018 11:30:14 PM By: Worthy Keeler PA-C Signed: 03/20/2018 8:21:26 AM By: Army Melia Entered By: Worthy Keeler on 03/18/2018 07:05:30 Glenn Garcia ([id number]) -------------------------------------------------------------------------------- SuperBill Details Patient Name: Glenn Garcia Date of Service: 03/16/2018 Medical Record Number: [id number] Patient Account Number: [id number] Date of Birth/Sex: 1935/07/18 (83 y.o. M) Treating RN: Army Melia Primary Care Provider: Fulton Reek Other Clinician: Referring Provider: Fulton Reek Treating Provider/Extender: Melburn Hake, Shannelle Alguire Weeks in Treatment: 4 Diagnosis Coding ICD-10 Codes Code Description F01.50 Vascular dementia without behavioral disturbance S12.100A Unspecified displaced fracture of second cervical vertebra, initial encounter for closed fracture L89.893 Pressure ulcer of other site, stage 3 L89.612 Pressure ulcer of right heel, stage 2 I10 Essential (primary) hypertension I48.20 Chronic atrial fibrillation, unspecified Facility Procedures CPT4 Code: [id number] Description: 99213 - WOUND CARE VISIT-LEV 3 EST PT Modifier: Quantity: 1 Physician Procedures CPT4: Description Modifier Quantity Code [id number] - WC PHYS LEVEL 4 - EST  PT 1 ICD-10 Diagnosis Description F01.50 Vascular dementia without behavioral disturbance S12.100A Unspecified displaced fracture of second cervical vertebra, initial  encounter for closed fracture L89.893 Pressure ulcer of other site, stage 3 L89.612 Pressure ulcer of right heel, stage 2 Electronic Signature(s) Signed: 03/19/2018 11:30:14 PM By: Worthy Keeler PA-C Entered By: Worthy Keeler on 03/18/2018 07:10:57

## 2018-03-20 NOTE — Progress Notes (Signed)
TERRYON, PINEIRO ([id number]) Visit Report for 02/21/2018 Chief Complaint Document Details Patient Name: Glenn Garcia, Glenn Garcia. Date of Service: 02/21/2018 3:00 PM Medical Record Number: [id number] Patient Account Number: [id number] Date of Birth/Sex: 1935-05-09 (83 y.o. M) Treating RN: Montey Hora Primary Care Provider: Fulton Reek Other Clinician: Referring Provider: Fulton Reek Treating Provider/Extender: Melburn Hake, HOYT Weeks in Treatment: 1 Information Obtained from: Patient Chief Complaint Chest wall ulcer Electronic Signature(s) Signed: 02/26/2018 7:15:48 AM By: Worthy Keeler PA-C Entered By: Worthy Keeler on 02/21/2018 15:03:25 Glenn Garcia ([id number]) -------------------------------------------------------------------------------- HPI Details Patient Name: Glenn Garcia Date of Service: 02/21/2018 3:00 PM Medical Record Number: [id number] Patient Account Number: [id number] Date of Birth/Sex: 03/29/1935 (83 y.o. M) Treating RN: Montey Hora Primary Care Provider: Fulton Reek Other Clinician: Referring Provider: Fulton Reek Treating Provider/Extender: Melburn Hake, HOYT Weeks in Treatment: 1 History of Present Illness HPI Description: 02/13/18 patient presents today for initial evaluation our clinic concerning issues that he has been having with a pressure area over the collarbone on the left secondary to having to wear a cervical collar since July 2019 when he sustained a C2 cervical fracture. His son and wife have been caring for him and have done an excellent job up to this point. With that being said he does prefer to lay on his left side and I think this may be making him more prone to breakdown and pressure at the caller region on the left. Fortunately there is no signs of infection this time and there is no bone exposed although this is a very precarious location just as in fact there's not much skin overlying the area of concern. With that  being said I do believe that there is no evidence of infection currently which is good news. The biggest concern is gonna be that he will continue to require offloading since he's gonna continue to we need the cervical collar in order to prevent further breakdown. Patient does have a history of fairly significant dimension along with hypertension and chronic issue fibrillation. Other than that obviously just the cervical fracture at C2 which he has been in the cervical collar for since July 2019. This injury occurred as a result of the fall. 02/21/18 on evaluation today patient appears to be much more agitated. Apparently this is something that he typically exhibits as the day progresses and unfortunately he's Artie had an appointment with cardiology today as well which only exacerbated the agitation. Nonetheless the wound that we were monitoring last visit over the clavicle region is actually significantly improved and is doing excellent in my pinion. Unfortunately the area underneath his chin where I was told they were just putting a little bit of anabiotic cream last week appears to actually be open with a significant although small hole that extends down to the jawbone unfortunately. This is where pressure has been exuded on the jawbone areas results of the cervical collar. Nonetheless we gonna definitely need to address this today. Electronic Signature(s) Signed: 02/26/2018 7:15:48 AM By: Worthy Keeler PA-C Entered By: Worthy Keeler on 02/25/2018 00:49:00 Glenn Garcia ([id number]) -------------------------------------------------------------------------------- Physical Exam Details Patient Name: Glenn Garcia Date of Service: 02/21/2018 3:00 PM Medical Record Number: [id number] Patient Account Number: [id number] Date of Birth/Sex: [birth year] (83 y.o. M) Treating RN: Montey Hora Primary Care Provider: Fulton Reek Other Clinician: Referring Provider: Fulton Reek Treating Provider/Extender: Melburn Hake, HOYT Weeks in Treatment: 1 Constitutional Well-nourished and well-hydrated in no acute distress. Respiratory normal breathing without  difficulty. clear to auscultation bilaterally. Cardiovascular regular rate and rhythm with normal S1, S2. Psychiatric this patient is able to make decisions and demonstrates good insight into disease process. Alert and Oriented x 3. pleasant and cooperative. Notes Patient's wounds again today were cleaned although no sharp debridement was required at this point. The worst area is actually going to turn out to be this jawline wound which is going to be very difficult to manage in my pinion especially considering the fact that the patient is going to need to continue to wear the cervical collar. This is of utmost importance. For that reason I believe that we are gonna likely have to offload the region as best we can and then see were things go from there. Also I do believe this area likely is going to need to be shaved which is something that will be more easily done by his family at home then trying to do that here currently. Electronic Signature(s) Signed: 02/26/2018 7:15:48 AM By: Worthy Keeler PA-C Entered By: Worthy Keeler on 02/25/2018 00:49:27 Glenn Garcia ([id number]) -------------------------------------------------------------------------------- Physician Orders Details Patient Name: Glenn Garcia Date of Service: 02/21/2018 3:00 PM Medical Record Number: [id number] Patient Account Number: [id number] Date of Birth/Sex: 1935-10-11 (83 y.o. M) Treating RN: Montey Hora Primary Care Provider: Fulton Reek Other Clinician: Referring Provider: Fulton Reek Treating Provider/Extender: Melburn Hake, HOYT Weeks in Treatment: 1 Verbal / Phone Orders: No Diagnosis Coding ICD-10 Coding Code Description F01.50 Vascular dementia without behavioral disturbance S12.100A Unspecified displaced  fracture of second cervical vertebra, initial encounter for closed fracture L89.893 Pressure ulcer of other site, stage 3 I10 Essential (primary) hypertension I48.20 Chronic atrial fibrillation, unspecified Wound Cleansing Wound #1 Left,Proximal Chest o Cleanse wound with mild soap and water o May Shower, gently pat wound dry prior to applying new dressing. Primary Wound Dressing o Silver Collagen - moisten with saline Secondary Dressing Wound #1 Left,Proximal Chest o Foam - Cut out two donuts (double up) to place around wound o Non-adherent pad Dressing Change Frequency Wound #1 Left,Proximal Chest o Dressing is to be changed Monday and Thursday. - As needed if dressing becomes soiled or saturated. Follow-up Appointments Wound #1 Left,Proximal Chest o Return Appointment in 1 week. Electronic Signature(s) Signed: 02/21/2018 4:52:44 PM By: Montey Hora Signed: 02/26/2018 7:15:48 AM By: Worthy Keeler PA-C Entered By: Montey Hora on 02/21/2018 15:46:16 Glenn Garcia, Glenn Garcia ([id number]) -------------------------------------------------------------------------------- Problem List Details Patient Name: ALASSANE, KALAFUT Date of Service: 02/21/2018 3:00 PM Medical Record Number: [id number] Patient Account Number: [id number] Date of Birth/Sex: 02-Oct-1935 (83 y.o. M) Treating RN: Montey Hora Primary Care Provider: Fulton Reek Other Clinician: Referring Provider: Fulton Reek Treating Provider/Extender: Melburn Hake, HOYT Weeks in Treatment: 1 Active Problems ICD-10 Evaluated Encounter Code Description Active Date Today Diagnosis F01.50 Vascular dementia without behavioral disturbance 02/13/2018 No Yes S12.100A Unspecified displaced fracture of second cervical vertebra, 02/13/2018 No Yes initial encounter for closed fracture L89.893 Pressure ulcer of other site, stage 3 02/13/2018 No Yes I10 Essential (primary) hypertension 02/13/2018 No Yes I48.20 Chronic atrial  fibrillation, unspecified 02/13/2018 No Yes Inactive Problems Resolved Problems Electronic Signature(s) Signed: 02/26/2018 7:15:48 AM By: Worthy Keeler PA-C Entered By: Worthy Keeler on 02/21/2018 15:03:13 Glenn Garcia, Glenn Garcia ([id number]) -------------------------------------------------------------------------------- Progress Note Details Patient Name: Glenn Garcia Date of Service: 02/21/2018 3:00 PM Medical Record Number: [id number] Patient Account Number: [id number] Date of Birth/Sex: [birth year] (83 y.o. M) Treating RN: Montey Hora Primary Care Provider: Fulton Reek Other Clinician: Referring Provider:  Fulton Reek Treating Provider/Extender: Melburn Hake, HOYT Weeks in Treatment: 1 Subjective Chief Complaint Information obtained from Patient Chest wall ulcer History of Present Illness (HPI) 02/13/18 patient presents today for initial evaluation our clinic concerning issues that he has been having with a pressure area over the collarbone on the left secondary to having to wear a cervical collar since July 2019 when he sustained a C2 cervical fracture. His son and wife have been caring for him and have done an excellent job up to this point. With that being said he does prefer to lay on his left side and I think this may be making him more prone to breakdown and pressure at the caller region on the left. Fortunately there is no signs of infection this time and there is no bone exposed although this is a very precarious location just as in fact there's not much skin overlying the area of concern. With that being said I do believe that there is no evidence of infection currently which is good news. The biggest concern is gonna be that he will continue to require offloading since he's gonna continue to we need the cervical collar in order to prevent further breakdown. Patient does have a history of fairly significant dimension along with hypertension and chronic issue  fibrillation. Other than that obviously just the cervical fracture at C2 which he has been in the cervical collar for since July 2019. This injury occurred as a result of the fall. 02/21/18 on evaluation today patient appears to be much more agitated. Apparently this is something that he typically exhibits as the day progresses and unfortunately he's Artie had an appointment with cardiology today as well which only exacerbated the agitation. Nonetheless the wound that we were monitoring last visit over the clavicle region is actually significantly improved and is doing excellent in my pinion. Unfortunately the area underneath his chin where I was told they were just putting a little bit of anabiotic cream last week appears to actually be open with a significant although small hole that extends down to the jawbone unfortunately. This is where pressure has been exuded on the jawbone areas results of the cervical collar. Nonetheless we gonna definitely need to address this today. Patient History Information obtained from Patient. Family History Cancer - Father,Siblings, Diabetes - Siblings, Heart Disease - Siblings, Lung Disease - Siblings, Stroke - Siblings, No family history of Hereditary Spherocytosis, Hypertension, Kidney Disease, Seizures, Thyroid Problems, Tuberculosis. Social History Never smoker, Marital Status - Married, Alcohol Use - Never, Drug Use - No History, Caffeine Use - Never. Medical History Eyes Patient has history of Cataracts - removed in right, Glaucoma Denies history of Optic Neuritis Ear/Nose/Mouth/Throat Denies history of Chronic sinus problems/congestion, Middle ear problems Hematologic/Lymphatic Denies history of Anemia, Hemophilia, Human Immunodeficiency Virus, Lymphedema, Sickle Cell Disease Respiratory Denies history of Aspiration, Asthma, Chronic Obstructive Pulmonary Disease (COPD), Pneumothorax, Sleep Apnea, Hepp, Glenn Garcia  ([id number]) Tuberculosis Cardiovascular Patient has history of Arrhythmia - a fib, Hypertension Denies history of Angina, Congestive Heart Failure, Coronary Artery Disease, Deep Vein Thrombosis, Hypotension, Myocardial Infarction, Peripheral Arterial Disease, Peripheral Venous Disease, Phlebitis, Vasculitis Gastrointestinal Denies history of Cirrhosis , Colitis, Crohn s, Hepatitis A, Hepatitis B, Hepatitis C Endocrine Denies history of Type I Diabetes, Type II Diabetes Genitourinary Denies history of End Stage Renal Disease Immunological Denies history of Lupus Erythematosus, Raynaud s, Scleroderma Integumentary (Skin) Denies history of History of Burn, History of pressure wounds Musculoskeletal Patient has history of Osteoarthritis Denies history of  Gout, Rheumatoid Arthritis, Osteomyelitis Neurologic Patient has history of Dementia Denies history of Neuropathy, Quadriplegia, Paraplegia, Seizure Disorder Oncologic Denies history of Received Chemotherapy, Received Radiation Medical And Surgical History Notes Gastrointestinal GERD Musculoskeletal fracture of c2 with chronic cervical collar Review of Systems (ROS) Constitutional Symptoms (General Health) Denies complaints or symptoms of Fever, Chills. Respiratory The patient has no complaints or symptoms. Cardiovascular The patient has no complaints or symptoms. Psychiatric The patient has no complaints or symptoms. Objective Constitutional Well-nourished and well-hydrated in no acute distress. Vitals Time Taken: 3:11 PM, Height: 62 in, Weight: 112 lbs, BMI: 20.5, Pulse: 86 bpm, Respiratory Rate: 18 breaths/min, Blood Pressure: 112/81 mmHg. General Notes: Unable to get temperature, patient agitated Respiratory Glenn Garcia, Glenn V. ([id number]) normal breathing without difficulty. clear to auscultation bilaterally. Cardiovascular regular rate and rhythm with normal S1, S2. Psychiatric this patient is able to make decisions  and demonstrates good insight into disease process. Alert and Oriented x 3. pleasant and cooperative. General Notes: Patient's wounds again today were cleaned although no sharp debridement was required at this point. The worst area is actually going to turn out to be this jawline wound which is going to be very difficult to manage in my pinion especially considering the fact that the patient is going to need to continue to wear the cervical collar. This is of utmost importance. For that reason I believe that we are gonna likely have to offload the region as best we can and then see were things go from there. Also I do believe this area likely is going to need to be shaved which is something that will be more easily done by his family at home then trying to do that here currently. Integumentary (Hair, Skin) Wound #1 status is Open. Original cause of wound was Pressure Injury. The wound is located on the Left,Proximal Chest. The wound measures 0.5cm length x 0.5cm width x 0.1cm depth; 0.196cm^2 area and 0.02cm^3 volume. There is Fat Layer (Subcutaneous Tissue) Exposed exposed. There is no tunneling or undermining noted. There is a medium amount of serous drainage noted. The wound margin is flat and intact. There is large (67-100%) red granulation within the wound bed. There is a small (1-33%) amount of necrotic tissue within the wound bed including Adherent Slough. The periwound skin appearance exhibited: Ecchymosis. The periwound skin appearance did not exhibit: Callus, Crepitus, Excoriation, Induration, Rash, Scarring, Dry/Scaly, Maceration, Atrophie Blanche, Cyanosis, Hemosiderin Staining, Mottled, Pallor, Rubor, Erythema. Periwound temperature was noted as No Abnormality. Wound #2 status is Open. Original cause of wound was Gradually Appeared. The wound is located on the [place]. The wound measures 0.8cm length x 0.8cm width x 0.8cm depth; 0.503cm^2 area and 0.402cm^3 volume. There is bone and Fat  Layer (Subcutaneous Tissue) Exposed exposed. There is no tunneling noted, however, there is undermining starting at 1:00 and ending at 1:00 with a maximum distance of 0.9cm. There is a medium amount of serous drainage noted. The wound margin is flat and intact. There is small (1-33%) pale granulation within the wound bed. There is a large (67-100%) amount of necrotic tissue within the wound bed including Adherent Slough. The periwound skin appearance did not exhibit: Callus, Crepitus, Excoriation, Induration, Rash, Scarring, Dry/Scaly, Maceration, Atrophie Blanche, Cyanosis, Ecchymosis, Hemosiderin Staining, Mottled, Pallor, Rubor, Erythema. Periwound temperature was noted as No Abnormality. The periwound has tenderness on palpation. Assessment Active Problems ICD-10 Vascular dementia without behavioral disturbance Unspecified displaced fracture of second cervical vertebra, initial encounter for closed fracture Pressure ulcer of  other site, stage 3 Essential (primary) hypertension Chronic atrial fibrillation, unspecified Plan Glenn Garcia, Glenn Garcia ([id number]) Wound Cleansing: Wound #1 Left,Proximal Chest: Cleanse wound with mild soap and water May Shower, gently pat wound dry prior to applying new dressing. Primary Wound Dressing: Silver Collagen - moisten with saline Secondary Dressing: Wound #1 Left,Proximal Chest: Foam - Cut out two donuts (double up) to place around wound Non-adherent pad Dressing Change Frequency: Wound #1 Left,Proximal Chest: Dressing is to be changed Monday and Thursday. - As needed if dressing becomes soiled or saturated. Follow-up Appointments: Wound #1 Left,Proximal Chest: Return Appointment in 1 week. I'm gonna recommend that we continue with the above wound care measures for the next week. Patient is in agreement the plan. Anything changes or worsens they will contact the office and let me know otherwise will see were things stand at that point. Please  see above for specific wound care orders. We will see patient for re-evaluation in 1 week(s) here in the clinic. If anything worsens or changes patient will contact our office for additional recommendations. Electronic Signature(s) Signed: 02/26/2018 7:15:48 AM By: Worthy Keeler PA-C Entered By: Worthy Keeler on 02/25/2018 00:49:39 Glenn Garcia ([id number]) -------------------------------------------------------------------------------- ROS/PFSH Details Patient Name: Glenn Garcia Date of Service: 02/21/2018 3:00 PM Medical Record Number: [id number] Patient Account Number: [id number] Date of Birth/Sex: 1935/03/29 (83 y.o. M) Treating RN: Montey Hora Primary Care Provider: Fulton Reek Other Clinician: Referring Provider: Fulton Reek Treating Provider/Extender: Melburn Hake, HOYT Weeks in Treatment: 1 Information Obtained From Patient Wound History Do you currently have one or more open woundso Yes How many open wounds do you currently haveo 2 Approximately how long have you had your woundso 6 weeks How have you been treating your wound(s) until nowo mupirocin Has your wound(s) ever healed and then re-openedo No Have you had any lab work done in the past montho Yes Who ordered the lab work doneo [facility] ED Have you tested positive for an antibiotic resistant organism (MRSA, VRE)o No Have you tested positive for osteomyelitis (bone infection)o No Have you had any tests for circulation on your legso No Constitutional Symptoms (General Health) Complaints and Symptoms: Negative for: Fever; Chills Eyes Medical History: Positive for: Cataracts - removed in right; Glaucoma Negative for: Optic Neuritis Ear/Nose/Mouth/Throat Medical History: Negative for: Chronic sinus problems/congestion; Middle ear problems Hematologic/Lymphatic Medical History: Negative for: Anemia; Hemophilia; Human Immunodeficiency Virus; Lymphedema; Sickle Cell Disease Respiratory Complaints and  Symptoms: No Complaints or Symptoms Medical History: Negative for: Aspiration; Asthma; Chronic Obstructive Pulmonary Disease (COPD); Pneumothorax; Sleep Apnea; Tuberculosis Cardiovascular Complaints and Symptoms: No Complaints or Symptoms Glenn Garcia, Glenn Garcia ([id number]) Medical History: Positive for: Arrhythmia - a fib; Hypertension Negative for: Angina; Congestive Heart Failure; Coronary Artery Disease; Deep Vein Thrombosis; Hypotension; Myocardial Infarction; Peripheral Arterial Disease; Peripheral Venous Disease; Phlebitis; Vasculitis Gastrointestinal Medical History: Negative for: Cirrhosis ; Colitis; Crohnos; Hepatitis A; Hepatitis B; Hepatitis C Past Medical History Notes: GERD Endocrine Medical History: Negative for: Type I Diabetes; Type II Diabetes Genitourinary Medical History: Negative for: End Stage Renal Disease Immunological Medical History: Negative for: Lupus Erythematosus; Raynaudos; Scleroderma Integumentary (Skin) Medical History: Negative for: History of Burn; History of pressure wounds Musculoskeletal Medical History: Positive for: Osteoarthritis Negative for: Gout; Rheumatoid Arthritis; Osteomyelitis Past Medical History Notes: fracture of c2 with chronic cervical collar Neurologic Medical History: Positive for: Dementia Negative for: Neuropathy; Quadriplegia; Paraplegia; Seizure Disorder Oncologic Medical History: Negative for: Received Chemotherapy; Received Radiation Psychiatric Complaints and Symptoms: No Complaints or Symptoms HBO  Extended History Items Eyes: Eyes: Cataracts Glaucoma Glenn Garcia, Glenn Garcia ([id number]) Immunizations Pneumococcal Vaccine: Received Pneumococcal Vaccination: Yes Immunization Notes: up to date Implantable Devices Family and Social History Cancer: Yes - Father,Siblings; Diabetes: Yes - Siblings; Heart Disease: Yes - Siblings; Hereditary Spherocytosis: No; Hypertension: No; Kidney Disease: No; Lung Disease: Yes  - Siblings; Seizures: No; Stroke: Yes - Siblings; Thyroid Problems: No; Tuberculosis: No; Never smoker; Marital Status - Married; Alcohol Use: Never; Drug Use: No History; Caffeine Use: Never; Financial Concerns: No; Food, Clothing or Shelter Needs: No; Support System Lacking: No; Transportation Concerns: No; Advanced Directives: Yes (Not Provided); Patient does not want information on Advanced Directives; Living Will: Yes (Not Provided); Medical Power of Attorney: Yes - spouse (Not Provided) Physician Affirmation I have reviewed and agree with the above information. Electronic Signature(s) Signed: 02/26/2018 7:15:48 AM By: Worthy Keeler PA-C Signed: 03/20/2018 10:44:46 AM By: Montey Hora Entered By: Worthy Keeler on 02/25/2018 00:49:17 Glenn Garcia ([id number]) -------------------------------------------------------------------------------- SuperBill Details Patient Name: Glenn Garcia Date of Service: 02/21/2018 Medical Record Number: [id number] Patient Account Number: [id number] Date of Birth/Sex: May 07, 1935 (83 y.o. M) Treating RN: Montey Hora Primary Care Provider: Fulton Reek Other Clinician: Referring Provider: Fulton Reek Treating Provider/Extender: Melburn Hake, HOYT Weeks in Treatment: 1 Diagnosis Coding ICD-10 Codes Code Description F01.50 Vascular dementia without behavioral disturbance S12.100A Unspecified displaced fracture of second cervical vertebra, initial encounter for closed fracture L89.893 Pressure ulcer of other site, stage 3 I10 Essential (primary) hypertension I48.20 Chronic atrial fibrillation, unspecified Facility Procedures CPT4 Code: [id number] Description: 99213 - WOUND CARE VISIT-LEV 3 EST PT Modifier: Quantity: 1 Physician Procedures CPT4: Description Modifier Quantity Code [id number] 99214 - WC PHYS LEVEL 4 - EST PT 1 ICD-10 Diagnosis Description F01.50 Vascular dementia without behavioral disturbance S12.100A Unspecified displaced  fracture of second cervical vertebra, initial  encounter for closed fracture L89.893 Pressure ulcer of other site, stage 3 I10 Essential (primary) hypertension Electronic Signature(s) Signed: 02/26/2018 7:15:48 AM By: Worthy Keeler PA-C Entered By: Worthy Keeler on 02/21/2018 23:55:26

## 2018-05-12 DEATH — deceased

## 2020-05-10 IMAGING — CT CT CERVICAL SPINE W/O CM
3 of 4 series · 10 of 33 positions shown, 12 images · non-contrast
Comparison: CT cervical spine 07/20/2017.

CLINICAL DATA: The patient suffered a type 2 dens fracture in a
fall 07/20/2017. Subsequent encounter.

EXAM:
CT CERVICAL SPINE WITHOUT CONTRAST
TECHNIQUE: Multidetector CT imaging of the cervical spine was performed without
intravenous contrast. Multiplanar CT image reconstructions were also
generated.

[Series 6: sagittal bone · sagittal · 0.24mm/px · 5 of 45 slices shown, 6 images]
[im 15/45  bone]
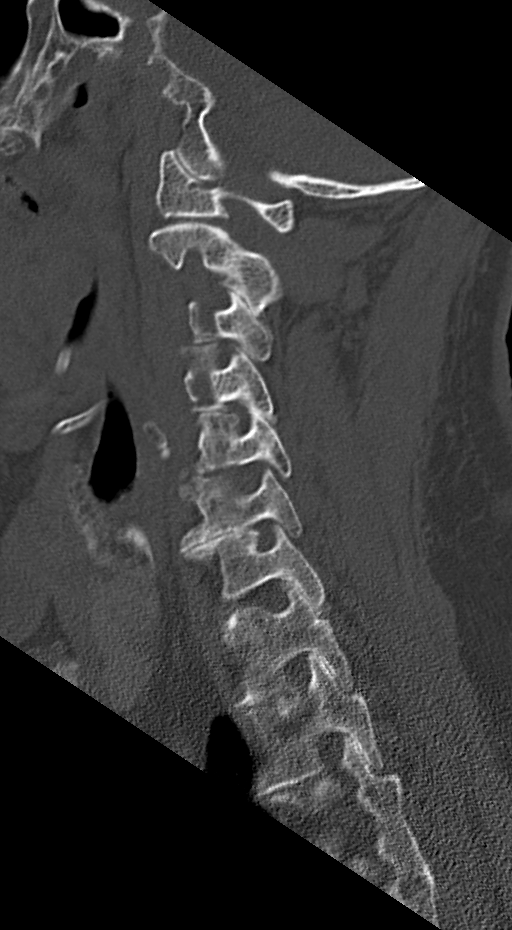
[im 19/45  bone]
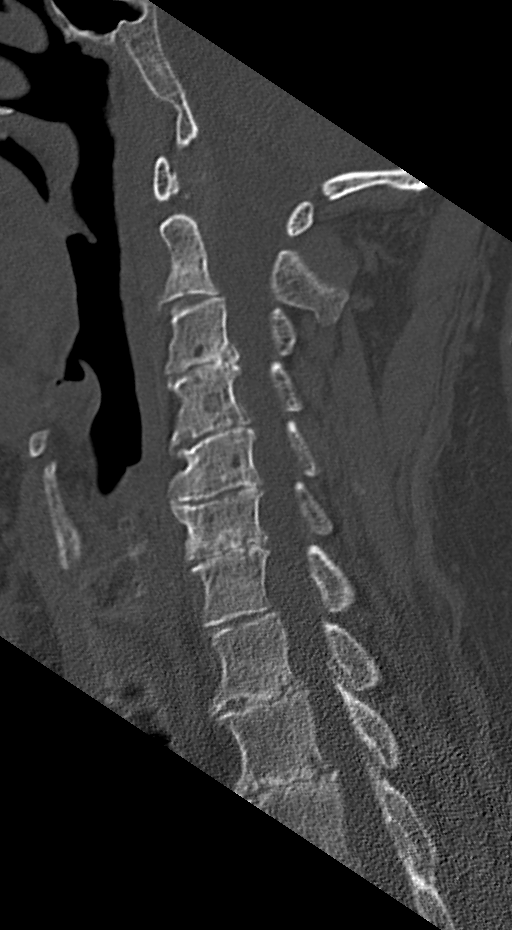
[im 23/45  soft-tissue]
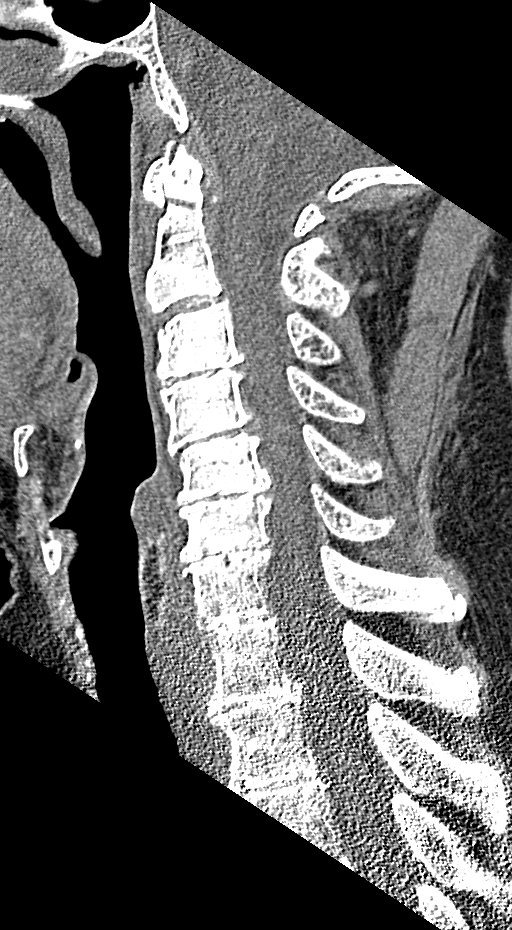
[im 23/45  bone]
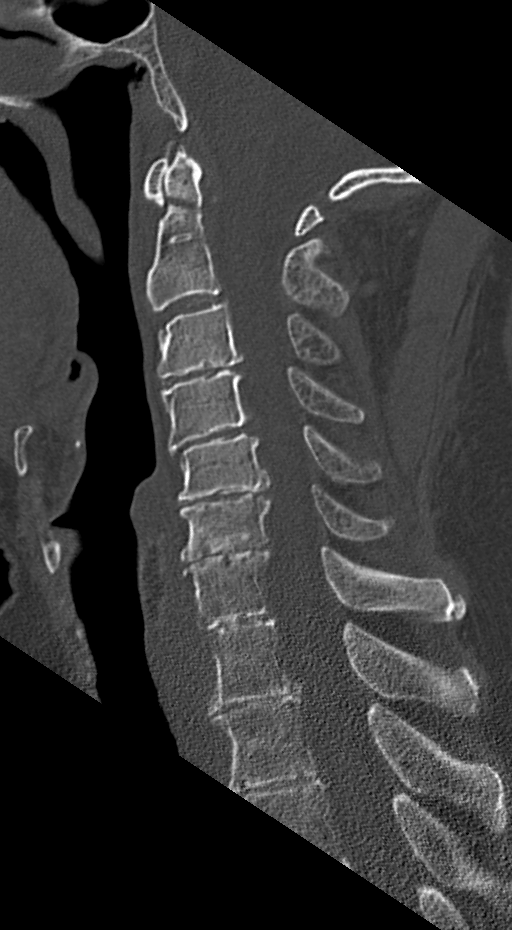
[im 26/45  bone]
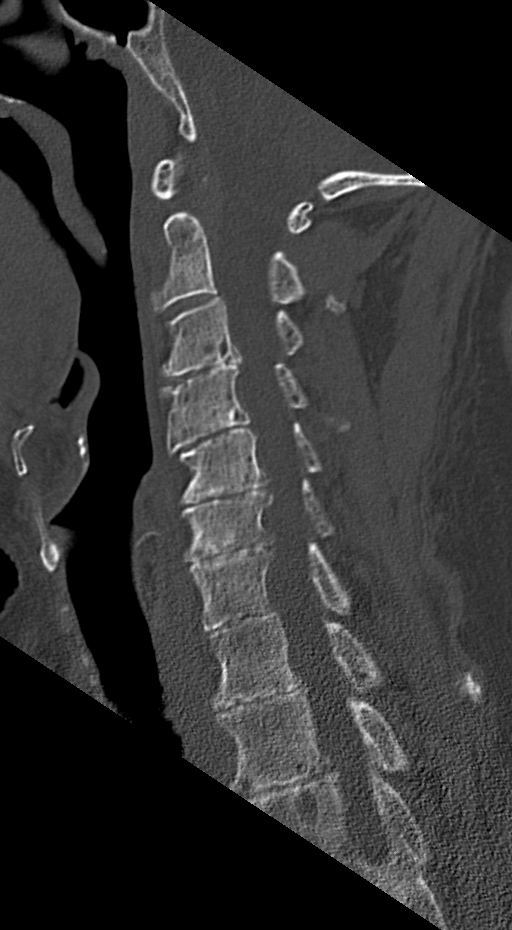
[im 30/45  bone]
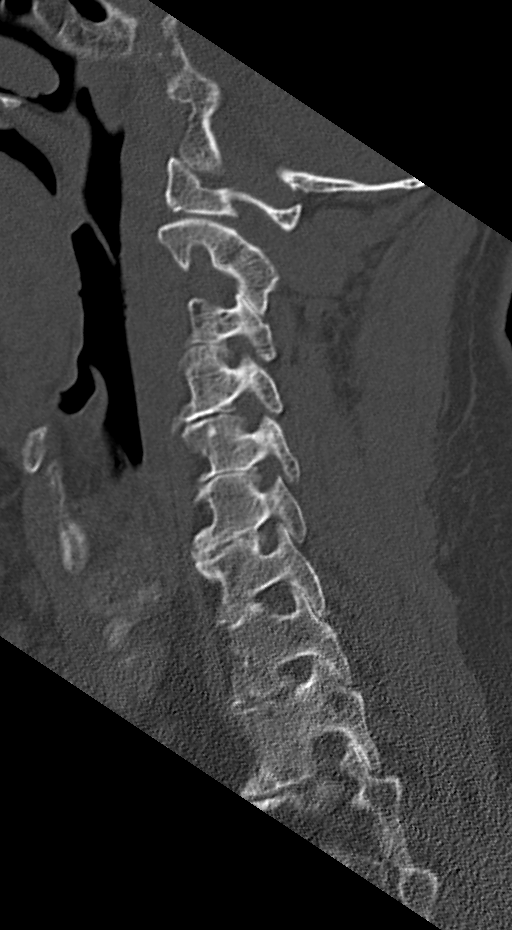

[Series 7: coronal bone · coronal · 0.24mm/px · 3 of 56 slices shown]
[im 15/56  bone]
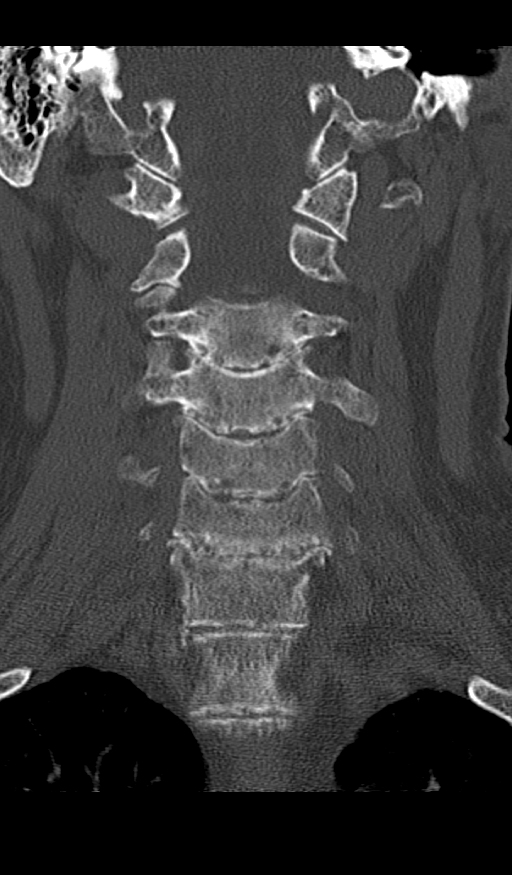
[im 24/56  bone]
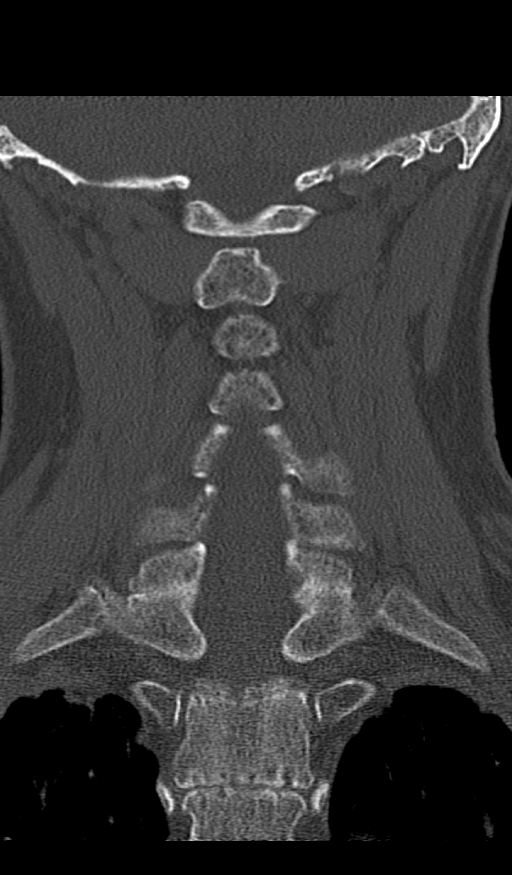
[im 33/56  bone]
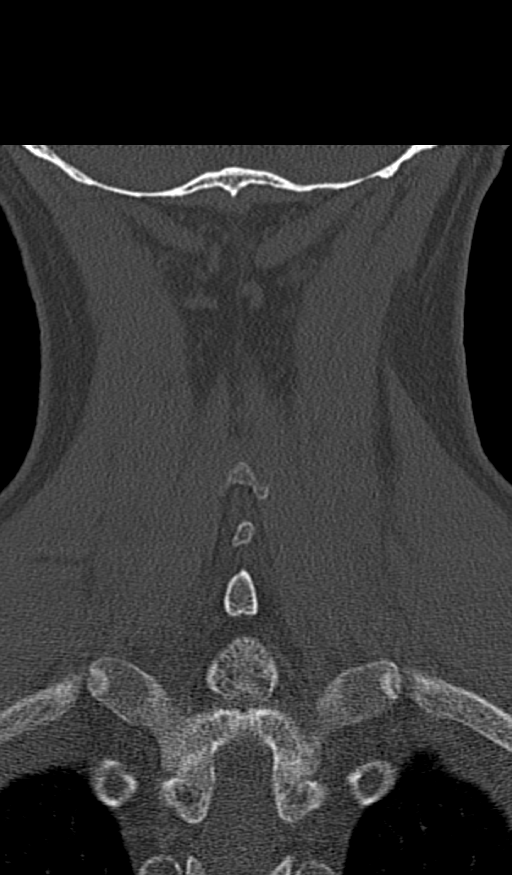

[Series 8: orthogonal bone · axial · 0.22mm/px · z∈[+29,+136]mm · 2 of 128 slices shown, 3 images]
[im 32/128  soft-tissue]
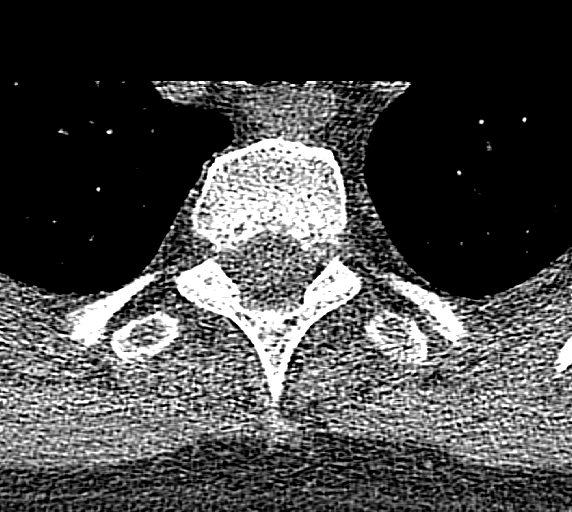
[im 32/128  bone]
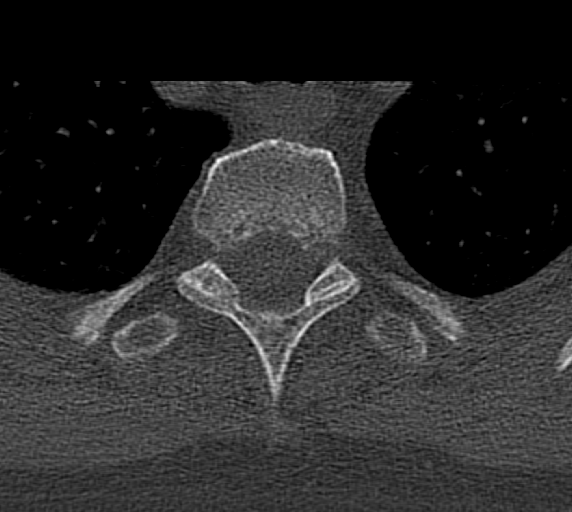
[im 96/128  bone]
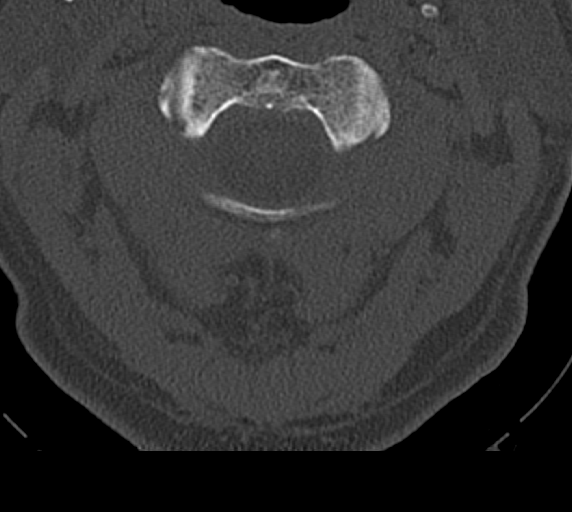

[10 of 33 positions shown; findings below may reference images not displayed]

FINDINGS: Alignment: Trace facet mediated anterolisthesis C2 on C3 is
unchanged.

Skull base and vertebrae: Again seen is a transverse type 2 dens
fracture. No bridging bone is identified across the fracture and
fracture margins appear to be corticating. No other fracture is
identified. No focal bony lesion.

Soft tissues and spinal canal: Negative.

Disc levels:  No change in multilevel degenerative disc disease.

Upper chest: Lung apices clear.

Other: Low density lesion in the right vallecula as seen on the
prior examination is unchanged. Likely sebaceous cyst or dermal
inclusion cyst in the posterior subcutaneous tissues of the right
neck is also unchanged.
IMPRESSION: Nondisplaced type 2 dens fracture is again seen. No bridging bone is
identified about the fracture and fracture margins appear to be
corticating worrisome for impending nonunion.

No change in a lesion the right vallecula likely representing a
retention cyst.
